# Patient Record
Sex: Female | Born: 1980 | Race: White | Hispanic: No | Marital: Single | Smoking: Current every day smoker
Health system: Southern US, Community
[De-identification: ages and names within clinical notes are randomized; demographics above are authoritative.]

## PROBLEM LIST (undated history)

## (undated) ENCOUNTER — Inpatient Hospital Stay (HOSPITAL_COMMUNITY): Payer: Self-pay

## (undated) DIAGNOSIS — N83209 Unspecified ovarian cyst, unspecified side: Secondary | ICD-10-CM

## (undated) DIAGNOSIS — R51 Headache: Secondary | ICD-10-CM

## (undated) DIAGNOSIS — O24419 Gestational diabetes mellitus in pregnancy, unspecified control: Secondary | ICD-10-CM

## (undated) DIAGNOSIS — F419 Anxiety disorder, unspecified: Secondary | ICD-10-CM

## (undated) DIAGNOSIS — B999 Unspecified infectious disease: Secondary | ICD-10-CM

## (undated) DIAGNOSIS — I10 Essential (primary) hypertension: Secondary | ICD-10-CM

## (undated) DIAGNOSIS — Z5189 Encounter for other specified aftercare: Secondary | ICD-10-CM

## (undated) HISTORY — DX: Gestational diabetes mellitus in pregnancy, unspecified control: O24.419

## (undated) HISTORY — PX: DILATION AND CURETTAGE OF UTERUS: SHX78

## (undated) HISTORY — PX: CHOLECYSTECTOMY: SHX55

---

## 2000-10-26 ENCOUNTER — Emergency Department (HOSPITAL_COMMUNITY): Admission: EM | Admit: 2000-10-26 | Discharge: 2000-10-26 | Payer: Self-pay | Admitting: Emergency Medicine

## 2001-01-24 ENCOUNTER — Emergency Department (HOSPITAL_COMMUNITY): Admission: EM | Admit: 2001-01-24 | Discharge: 2001-01-24 | Payer: Self-pay | Admitting: Emergency Medicine

## 2001-01-24 ENCOUNTER — Encounter: Payer: Self-pay | Admitting: Emergency Medicine

## 2003-11-30 ENCOUNTER — Emergency Department (HOSPITAL_COMMUNITY): Admission: EM | Admit: 2003-11-30 | Discharge: 2003-11-30 | Payer: Self-pay | Admitting: Emergency Medicine

## 2004-11-05 ENCOUNTER — Emergency Department (HOSPITAL_COMMUNITY): Admission: EM | Admit: 2004-11-05 | Discharge: 2004-11-05 | Payer: Self-pay | Admitting: Emergency Medicine

## 2006-05-26 DIAGNOSIS — Z5189 Encounter for other specified aftercare: Secondary | ICD-10-CM

## 2006-05-26 HISTORY — DX: Encounter for other specified aftercare: Z51.89

## 2007-02-12 ENCOUNTER — Ambulatory Visit: Payer: Self-pay | Admitting: Hematology & Oncology

## 2007-04-13 ENCOUNTER — Encounter: Payer: Self-pay | Admitting: Obstetrics and Gynecology

## 2007-04-13 ENCOUNTER — Inpatient Hospital Stay (HOSPITAL_COMMUNITY): Admission: AD | Admit: 2007-04-13 | Discharge: 2007-04-15 | Payer: Self-pay | Admitting: Obstetrics & Gynecology

## 2007-04-13 ENCOUNTER — Ambulatory Visit: Payer: Self-pay | Admitting: Obstetrics and Gynecology

## 2007-06-24 ENCOUNTER — Encounter: Admission: RE | Admit: 2007-06-24 | Discharge: 2007-08-10 | Payer: Self-pay | Admitting: Orthopedic Surgery

## 2008-03-07 ENCOUNTER — Encounter: Admission: RE | Admit: 2008-03-07 | Discharge: 2008-05-25 | Payer: Self-pay | Admitting: Orthopedic Surgery

## 2008-12-12 ENCOUNTER — Encounter: Admission: RE | Admit: 2008-12-12 | Discharge: 2008-12-12 | Payer: Self-pay | Admitting: Family Medicine

## 2009-01-08 ENCOUNTER — Emergency Department (HOSPITAL_COMMUNITY): Admission: EM | Admit: 2009-01-08 | Discharge: 2009-01-08 | Payer: Self-pay | Admitting: Emergency Medicine

## 2009-05-07 ENCOUNTER — Encounter: Admission: RE | Admit: 2009-05-07 | Discharge: 2009-05-24 | Payer: Self-pay | Admitting: Family Medicine

## 2009-05-24 ENCOUNTER — Encounter: Admission: RE | Admit: 2009-05-24 | Discharge: 2009-07-03 | Payer: Self-pay | Admitting: Family Medicine

## 2009-07-27 ENCOUNTER — Encounter: Admission: RE | Admit: 2009-07-27 | Discharge: 2009-07-27 | Payer: Self-pay | Admitting: Family Medicine

## 2010-01-30 ENCOUNTER — Emergency Department (HOSPITAL_COMMUNITY): Admission: EM | Admit: 2010-01-30 | Discharge: 2010-01-31 | Payer: Self-pay | Admitting: Emergency Medicine

## 2010-03-18 ENCOUNTER — Emergency Department (HOSPITAL_COMMUNITY): Admission: EM | Admit: 2010-03-18 | Discharge: 2010-03-18 | Payer: Self-pay | Admitting: Emergency Medicine

## 2010-06-16 ENCOUNTER — Encounter: Payer: Self-pay | Admitting: Family Medicine

## 2010-06-26 ENCOUNTER — Emergency Department (HOSPITAL_COMMUNITY)
Admission: EM | Admit: 2010-06-26 | Discharge: 2010-06-26 | Disposition: A | Discharge status: Home / Self Care | Payer: Self-pay | Attending: Emergency Medicine | Admitting: Emergency Medicine

## 2010-06-26 DIAGNOSIS — Y929 Unspecified place or not applicable: Secondary | ICD-10-CM | POA: Insufficient documentation

## 2010-06-26 DIAGNOSIS — S025XXA Fracture of tooth (traumatic), initial encounter for closed fracture: Secondary | ICD-10-CM | POA: Insufficient documentation

## 2010-06-26 DIAGNOSIS — Z79899 Other long term (current) drug therapy: Secondary | ICD-10-CM | POA: Insufficient documentation

## 2010-06-26 DIAGNOSIS — F411 Generalized anxiety disorder: Secondary | ICD-10-CM | POA: Insufficient documentation

## 2010-06-26 DIAGNOSIS — X58XXXA Exposure to other specified factors, initial encounter: Secondary | ICD-10-CM | POA: Insufficient documentation

## 2010-06-26 DIAGNOSIS — K089 Disorder of teeth and supporting structures, unspecified: Secondary | ICD-10-CM | POA: Insufficient documentation

## 2010-08-08 LAB — BASIC METABOLIC PANEL
BUN: 10 mg/dL (ref 6–23)
CO2: 25 mEq/L (ref 19–32)
Chloride: 105 mEq/L (ref 96–112)
GFR calc non Af Amer: >60 mL/min (ref >60)
Glucose, Bld: 122 mg/dL — ABNORMAL HIGH (ref 70–99)
Potassium: 3.4 mEq/L — ABNORMAL LOW (ref 3.5–5.1)

## 2010-08-08 LAB — CBC
HCT: 46.9 % — ABNORMAL HIGH (ref 36.0–46.0)
MCH: 32.1 pg (ref 26.0–34.0)
MCHC: 34.2 g/dL (ref 30.0–36.0)
MCV: 94 fL (ref 78.0–100.0)
RDW: 13.9 % (ref 11.5–15.5)

## 2010-08-08 LAB — DIFFERENTIAL
Basophils Absolute: 0.1 10*3/uL (ref 0.0–0.1)
Basophils Relative: 0 % (ref 0–1)
Eosinophils Absolute: 0.3 10*3/uL (ref 0.0–0.7)
Eosinophils Relative: 2 % (ref 0–5)
Monocytes Absolute: 1.1 10*3/uL — ABNORMAL HIGH (ref 0.1–1.0)
Monocytes Relative: 8 % (ref 3–12)

## 2010-08-08 LAB — URINE CULTURE: Culture: NO GROWTH

## 2010-08-08 LAB — URINE MICROSCOPIC-ADD ON

## 2010-08-08 LAB — URINALYSIS, ROUTINE W REFLEX MICROSCOPIC
Hgb urine dipstick: NEGATIVE
Specific Gravity, Urine: 1.031 — ABNORMAL HIGH (ref 1.005–1.030)
Urobilinogen, UA: >8 mg/dL — ABNORMAL HIGH (ref 0.0–1.0)

## 2010-10-08 NOTE — Op Note (Signed)
Tammy, Robinson               ACCOUNT NO.:  [id number]   MEDICAL RECORD NO.:  [id number]          PATIENT TYPE:  INP   LOCATION:  9131                          FACILITY:  [facility]   PHYSICIAN:  Tammy Robinson, M.D.     DATE OF BIRTH:  03/16/1981   DATE OF PROCEDURE:  04/13/2007  DATE OF DISCHARGE:                               OPERATIVE REPORT   PROCEDURE:  Pelvic examination under anesthesia, dilatation and  curettage.   PREOPERATIVE DIAGNOSIS:  Postpartum hemorrhage.   POSTOPERATIVE DIAGNOSIS:  Retained placental cotyledon.   SURGEON:  Javier Glazier. Okey Robinson, M.D.   ANESTHESIA:  General.   ESTIMATED BLOOD LOSS:  100 mL.   POSTOPERATIVE CONDITION:  Satisfactory.   REASON FOR PROCEDURE:  The patient, a 30 year old multiparous white  female, who delivered approximately 6:00 a.m. in the morning, was  transferred to the floor. I got call at at 12:10 p.m. The patient was  hemorrhaging.  I went to examine her, found it very difficult to examine  her in bed, took her to the operating room under general anesthesia to  look for any lacerations as the uterus was very firm.  No lacerations  were seen within the vagina or the cervix.  There was one small bleeding  area at the left angle of the cervix for which a figure-of-eight suture  was used to control.  The uterus was explored with a ring forceps  followed by curettage with a large banjo __________  curette and a large  cotyledon was obtained from the endometrial cavity.  The uterus  immediately became firm.  Bleeding reduced to just a trickle and the  patient was transferred to recovery room in satisfactory condition  having tolerated the procedure well.      Tammy Robinson, M.D.  Electronically Signed     PDR/MEDQ  D:  04/13/2007  T:  04/14/2007  Job:  [id number]

## 2010-10-08 NOTE — Discharge Summary (Signed)
NAMELAYLAH, Tammy Robinson               ACCOUNT NO.:  [id number]   MEDICAL RECORD NO.:  [id number]          PATIENT TYPE:  INP   LOCATION:  9131                          FACILITY:  [facility]   PHYSICIAN:  Phil D. Okey Dupre, M.D.     DATE OF BIRTH:  03/09/1981   DATE OF ADMISSION:  04/13/2007  DATE OF DISCHARGE:  04/15/2007                               DISCHARGE SUMMARY   DIAGNOSES ON DISCHARGE:  1. Spontaneous vaginal delivery of a viable female.  2. Postpartum hemorrhage with retained products.  3. Dilation and curettage April 13, 2007.  4. Anemia secondary to hemorrhage.  5. Transfusion two units of packed red blood cells.   BRIEF HOSPITAL COURSE:  [name] is a 30 year old G4, now P3 0-1-3,  who was admitted to the hospital in [facility] November 18, where she  had spontaneous vaginal delivery of a viable female with Apgars of 9 and 9  at 0630 on November 18.  Placenta was delivered spontaneously with three  vessel cord and appeared intact upon inspection with estimated blood  loss of 350 ml.  Following delivery, the patient continued to have  bleeding and was given IM Methergine in the labor suite with good  resolution of bleeding. However, upon being moved to the postpartum  floor, the patient ambulated and had a large amount of vaginal bleeding  with large clots and continuous bleeding. The patient was evaluated and  taken to the operating room where dilation and curettage was performed,  which was significant for some retained products of conception.  Following D and C, the patient's bleeding normalized, however, serial  hemoglobin showed that the patient's hemoglobin had gone from 13.5 to  5.8 on the morning of November 20. The patient was largely asymptomatic,  although she was pale and mildly tachycardic. She was transfused two  units of packed red blood cells on November 20, without complication,  following which the patient felt much more energetic. She ambulated well  without any  dizziness, lightheadedness, headache. Her lochia was scant  and she was felt to be in good condition to be discharged home. She will  be discharged with a prescription for ferrous sulfate to be taken twice  a day as well as Colace, multi-vitamin and ibuprofen. The patient was  bottle feeding and was given an intramuscular  injection of Depo Provera  on discharge. She will follow up at the [facility]  Department when she desires an intrauterine device for contraception.  The patient is RH positive rubella immune and GBS negative.     ______________________________  Raymon Mutton, MD      Phil D. Okey Dupre, M.D.  Electronically Signed    JP/MEDQ  D:  04/15/2007  T:  04/16/2007  Job:  [id number]

## 2010-10-29 ENCOUNTER — Emergency Department (HOSPITAL_COMMUNITY)
Admission: EM | Admit: 2010-10-29 | Discharge: 2010-10-29 | Disposition: A | Discharge status: Home / Self Care | Payer: Self-pay | Attending: Emergency Medicine | Admitting: Emergency Medicine

## 2010-10-29 DIAGNOSIS — R300 Dysuria: Secondary | ICD-10-CM | POA: Insufficient documentation

## 2010-10-29 DIAGNOSIS — R10819 Abdominal tenderness, unspecified site: Secondary | ICD-10-CM | POA: Insufficient documentation

## 2010-10-29 DIAGNOSIS — N390 Urinary tract infection, site not specified: Secondary | ICD-10-CM | POA: Insufficient documentation

## 2010-10-29 LAB — URINALYSIS, ROUTINE W REFLEX MICROSCOPIC
Bilirubin Urine: NEGATIVE
Glucose, UA: NEGATIVE mg/dL
Protein, ur: 100 mg/dL — AB
Urobilinogen, UA: 1 mg/dL (ref 0.0–1.0)

## 2010-10-29 LAB — URINE MICROSCOPIC-ADD ON

## 2010-12-12 ENCOUNTER — Emergency Department (HOSPITAL_COMMUNITY)
Admission: EM | Admit: 2010-12-12 | Discharge: 2010-12-12 | Disposition: A | Discharge status: Home / Self Care | Payer: Self-pay | Attending: Emergency Medicine | Admitting: Emergency Medicine

## 2010-12-12 DIAGNOSIS — K0381 Cracked tooth: Secondary | ICD-10-CM | POA: Insufficient documentation

## 2010-12-12 DIAGNOSIS — K029 Dental caries, unspecified: Secondary | ICD-10-CM | POA: Insufficient documentation

## 2010-12-12 DIAGNOSIS — K089 Disorder of teeth and supporting structures, unspecified: Secondary | ICD-10-CM | POA: Insufficient documentation

## 2011-02-06 ENCOUNTER — Emergency Department (HOSPITAL_COMMUNITY)
Admission: EM | Admit: 2011-02-06 | Discharge: 2011-02-06 | Disposition: A | Discharge status: Home / Self Care | Payer: Self-pay | Attending: Emergency Medicine | Admitting: Emergency Medicine

## 2011-02-06 DIAGNOSIS — G4700 Insomnia, unspecified: Secondary | ICD-10-CM | POA: Insufficient documentation

## 2011-02-06 DIAGNOSIS — S060X0A Concussion without loss of consciousness, initial encounter: Secondary | ICD-10-CM | POA: Insufficient documentation

## 2011-02-06 DIAGNOSIS — W1809XA Striking against other object with subsequent fall, initial encounter: Secondary | ICD-10-CM | POA: Insufficient documentation

## 2011-03-04 LAB — CBC
HCT: 16.6 — ABNORMAL LOW
HCT: 20.1 — ABNORMAL LOW
HCT: 22.5 — ABNORMAL LOW
HCT: 38.8
Hemoglobin: 7.7 — CL
MCHC: 34.3
MCHC: 34.7
MCV: 91.9
MCV: 92.8
MCV: 93.4
Platelets: 279
Platelets: 284
Platelets: 318
Platelets: 405 — ABNORMAL HIGH
RBC: 2.41 — ABNORMAL LOW
RBC: 2.49 — ABNORMAL LOW
RBC: 4.22
RDW: 14.8
WBC: 11.1 — ABNORMAL HIGH
WBC: 13.8 — ABNORMAL HIGH
WBC: 14.6 — ABNORMAL HIGH
WBC: 19.3 — ABNORMAL HIGH
WBC: 20.2 — ABNORMAL HIGH

## 2011-03-04 LAB — TYPE AND SCREEN: Antibody Screen: NEGATIVE

## 2011-03-04 LAB — RPR: RPR Ser Ql: NONREACTIVE

## 2011-03-04 LAB — DIFFERENTIAL
Eosinophils Absolute: 0.1 — ABNORMAL LOW
Lymphocytes Relative: 14
Lymphs Abs: 2.8
Monocytes Relative: 6
Neutro Abs: 16.1 — ABNORMAL HIGH
Neutrophils Relative %: 80 — ABNORMAL HIGH

## 2011-03-04 LAB — DIC (DISSEMINATED INTRAVASCULAR COAGULATION)PANEL
D-Dimer, Quant: 1.15 — ABNORMAL HIGH
Fibrinogen: 367
Platelets: 378
Smear Review: NONE SEEN
aPTT: 25

## 2011-03-04 LAB — ABO/RH: ABO/RH(D): O POS

## 2011-03-04 LAB — HEPATITIS B SURFACE ANTIGEN: Hepatitis B Surface Ag: NEGATIVE

## 2012-02-04 ENCOUNTER — Emergency Department (HOSPITAL_COMMUNITY): Payer: No Typology Code available for payment source

## 2012-02-04 ENCOUNTER — Emergency Department (HOSPITAL_COMMUNITY)
Admission: EM | Admit: 2012-02-04 | Discharge: 2012-02-04 | Disposition: A | Discharge status: Home / Self Care | Payer: No Typology Code available for payment source | Attending: Emergency Medicine | Admitting: Emergency Medicine

## 2012-02-04 ENCOUNTER — Encounter (HOSPITAL_COMMUNITY): Payer: Self-pay | Admitting: Emergency Medicine

## 2012-02-04 DIAGNOSIS — S0003XA Contusion of scalp, initial encounter: Secondary | ICD-10-CM | POA: Insufficient documentation

## 2012-02-04 DIAGNOSIS — S0093XA Contusion of unspecified part of head, initial encounter: Secondary | ICD-10-CM

## 2012-02-04 MED ORDER — ACETAMINOPHEN 325 MG PO TABS
650.0000 mg | ORAL_TABLET | Freq: Once | ORAL | Status: AC
  Administered 2012-02-04: 650 mg via ORAL
  Filled 2012-02-04: qty 2

## 2012-02-04 MED ORDER — IBUPROFEN 800 MG PO TABS
800.0000 mg | ORAL_TABLET | Freq: Once | ORAL | Status: AC
  Administered 2012-02-04: 800 mg via ORAL
  Filled 2012-02-04: qty 1
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
## 2012-10-28 ENCOUNTER — Encounter (HOSPITAL_COMMUNITY): Payer: Self-pay | Admitting: Emergency Medicine

## 2012-10-28 ENCOUNTER — Emergency Department (HOSPITAL_COMMUNITY)
Admission: EM | Admit: 2012-10-28 | Discharge: 2012-10-28 | Discharge status: Against Medical Advice | Payer: Medicaid Other | Attending: Emergency Medicine | Admitting: Emergency Medicine

## 2012-10-28 ENCOUNTER — Emergency Department (HOSPITAL_COMMUNITY): Payer: Medicaid Other

## 2012-10-28 DIAGNOSIS — R51 Headache: Secondary | ICD-10-CM | POA: Insufficient documentation

## 2012-10-28 DIAGNOSIS — R1031 Right lower quadrant pain: Secondary | ICD-10-CM | POA: Insufficient documentation

## 2012-10-28 DIAGNOSIS — O99330 Smoking (tobacco) complicating pregnancy, unspecified trimester: Secondary | ICD-10-CM | POA: Insufficient documentation

## 2012-10-28 DIAGNOSIS — O210 Mild hyperemesis gravidarum: Secondary | ICD-10-CM | POA: Insufficient documentation

## 2012-10-28 DIAGNOSIS — Z9089 Acquired absence of other organs: Secondary | ICD-10-CM | POA: Insufficient documentation

## 2012-10-28 DIAGNOSIS — Z3490 Encounter for supervision of normal pregnancy, unspecified, unspecified trimester: Secondary | ICD-10-CM

## 2012-10-28 DIAGNOSIS — Z880 Allergy status to penicillin: Secondary | ICD-10-CM | POA: Insufficient documentation

## 2012-10-28 DIAGNOSIS — Z79899 Other long term (current) drug therapy: Secondary | ICD-10-CM | POA: Insufficient documentation

## 2012-10-28 DIAGNOSIS — O9989 Other specified diseases and conditions complicating pregnancy, childbirth and the puerperium: Secondary | ICD-10-CM | POA: Insufficient documentation

## 2012-10-28 LAB — URINALYSIS, ROUTINE W REFLEX MICROSCOPIC
Glucose, UA: NEGATIVE mg/dL
Ketones, ur: NEGATIVE mg/dL
Nitrite: NEGATIVE
Specific Gravity, Urine: 1.023 (ref 1.005–1.030)
pH: 6 (ref 5.0–8.0)

## 2012-10-28 LAB — CBC WITH DIFFERENTIAL/PLATELET
Basophils Absolute: 0 10*3/uL (ref 0.0–0.1)
Eosinophils Absolute: 0.2 10*3/uL (ref 0.0–0.7)
Eosinophils Relative: 1 % (ref 0–5)
HCT: 41.6 % (ref 36.0–46.0)
Lymphocytes Relative: 21 % (ref 12–46)
Lymphs Abs: 2.8 10*3/uL (ref 0.7–4.0)
MCH: 31.6 pg (ref 26.0–34.0)
MCV: 92.7 fL (ref 78.0–100.0)
Monocytes Absolute: 0.9 10*3/uL (ref 0.1–1.0)
Platelets: 365 10*3/uL (ref 150–400)
RDW: 14 % (ref 11.5–15.5)
WBC: 13.3 10*3/uL — ABNORMAL HIGH (ref 4.0–10.5)

## 2012-10-28 LAB — URINE MICROSCOPIC-ADD ON

## 2012-10-28 LAB — WET PREP, GENITAL: Yeast Wet Prep HPF POC: NONE SEEN

## 2012-10-28 LAB — COMPREHENSIVE METABOLIC PANEL
CO2: 26 mEq/L (ref 19–32)
Calcium: 9.6 mg/dL (ref 8.4–10.5)
Creatinine, Ser: 0.65 mg/dL (ref 0.50–1.10)
GFR calc Af Amer: >90 mL/min (ref >90)
GFR calc non Af Amer: >90 mL/min (ref >90)
Glucose, Bld: 87 mg/dL (ref 70–99)
Total Protein: 7 g/dL (ref 6.0–8.3)

## 2012-10-28 LAB — HCG, QUANTITATIVE, PREGNANCY: hCG, Beta Chain, Quant, S: 14056 m[IU]/mL — ABNORMAL HIGH (ref <5)

## 2012-10-28 LAB — ABO/RH: ABO/RH(D): O POS

## 2012-10-28 LAB — POCT PREGNANCY, URINE: Preg Test, Ur: POSITIVE — AB

## 2012-10-28 MED ORDER — HYDROMORPHONE HCL PF 1 MG/ML IJ SOLN
1.0000 mg | Freq: Once | INTRAMUSCULAR | Status: AC
  Administered 2012-10-28: 1 mg via INTRAVENOUS
  Filled 2012-10-28: qty 1

## 2012-10-28 MED ORDER — SODIUM CHLORIDE 0.9 % IV SOLN
1000.0000 mL | Freq: Once | INTRAVENOUS | Status: AC
  Administered 2012-10-28: 1000 mL via INTRAVENOUS

## 2012-10-28 MED ORDER — ONDANSETRON HCL 4 MG/2ML IJ SOLN
4.0000 mg | Freq: Once | INTRAMUSCULAR | Status: AC
  Administered 2012-10-28: 4 mg via INTRAVENOUS
  Filled 2012-10-28: qty 2

## 2012-10-28 MED ORDER — SODIUM CHLORIDE 0.9 % IV SOLN
1000.0000 mL | INTRAVENOUS | Status: DC
  Administered 2012-10-28: 1000 mL via INTRAVENOUS

## 2012-10-28 MED ORDER — ONDANSETRON HCL 4 MG PO TABS: 4.0000 mg | ORAL_TABLET | Freq: Four times a day (QID) | ORAL | Status: DC

## 2012-10-28 NOTE — ED Provider Notes (Signed)
History     CSN: [id number]  Arrival date & time 10/28/12  1556   First MD Initiated Contact with Patient 10/28/12 1626      Chief Complaint  Patient presents with  . Abdominal Pain    (Consider location/radiation/quality/duration/timing/severity/associated sxs/prior treatment) Patient is a 32 y.o. female presenting with abdominal pain. The history is provided by the patient and medical records. No language interpreter was used.  Abdominal Pain This is a new problem. The current episode started today. The problem occurs constantly. The problem has been gradually worsening. Associated symptoms include abdominal pain, headaches, nausea and vomiting. Pertinent negatives include no anorexia, arthralgias, change in bowel habit, chest pain, chills, congestion, coughing, diaphoresis, fatigue, fever, joint swelling, myalgias, neck pain, numbness, rash, sore throat, swollen glands, urinary symptoms, vertigo, visual change or weakness. Exacerbated by: palpation. She has tried nothing for the symptoms.   Tammy Robinson is a 32 y.o. female  With a medical history of anxiety and depression as well as migraine headache, [id number] presents to the Emergency Department complaining of gradual, persistent, progressively worsening right lower quadrant abdominal pain with associated nausea and vomiting beginning this morning. Patient's last menstrual cycle 09/17/2012. She states she is not on any birth control as they were attempting to get pregnant.  Nothing makes the pain better and movement and palpation makes pain worse. Patient denies fever, chills, headache, neck pain chest pain, shortness of breath, dysuria, hematuria melena, dizziness, syncope.   History reviewed. No pertinent past medical history.  Past Surgical History  Procedure Laterality Date  . Cholecystectomy      No family history on file.  History  Substance Use Topics  . Smoking status: Current Some Day Smoker  . Smokeless tobacco: Not  on file  . Alcohol Use: No    OB History   Grav Para Term Preterm Abortions TAB SAB Ect Mult Living                  Review of Systems  Constitutional: Negative for fever, chills, diaphoresis, appetite change, fatigue and unexpected weight change.  HENT: Negative for congestion, sore throat, mouth sores, trouble swallowing, neck pain and neck stiffness.   Respiratory: Negative for cough, chest tightness, shortness of breath, wheezing and stridor.   Cardiovascular: Negative for chest pain and palpitations.  Gastrointestinal: Positive for nausea, vomiting and abdominal pain. Negative for diarrhea, constipation, blood in stool, abdominal distention, rectal pain, anorexia and change in bowel habit.  Genitourinary: Negative for dysuria, urgency, frequency, hematuria, flank pain and difficulty urinating.  Musculoskeletal: Negative for myalgias, back pain, joint swelling and arthralgias.  Skin: Negative for rash.  Neurological: Positive for headaches. Negative for vertigo, weakness and numbness.  Hematological: Negative for adenopathy.  Psychiatric/Behavioral: Negative for confusion.  All other systems reviewed and are negative.    Allergies  Penicillins  Home Medications   Current Outpatient Rx  Name  Route  Sig  Dispense  Refill  . albuterol (PROVENTIL) (2.5 MG/3ML) 0.083% nebulizer solution   Nebulization   Take 2.5 mg by nebulization every 6 (six) hours as needed for wheezing.         Marland Kitchen ALPRAZolam (XANAX) 1 MG tablet   Oral   Take 1 mg by mouth 2 (two) times daily as needed.         . butalbital-acetaminophen-caffeine (FIORICET, ESGIC) 50-325-40 MG per tablet   Oral   Take 1 tablet by mouth 2 (two) times daily as needed for headache.         Marland Kitchen  citalopram (CELEXA) 20 MG tablet   Oral   Take 20 mg by mouth daily.         . cyclobenzaprine (FLEXERIL) 10 MG tablet   Oral   Take 10 mg by mouth 4 (four) times daily.         Marland Kitchen omeprazole (PRILOSEC) 20 MG capsule    Oral   Take 20 mg by mouth daily.         . traMADol (ULTRAM) 50 MG tablet   Oral   Take 50 mg by mouth every 6 (six) hours as needed for pain.         Marland Kitchen ondansetron (ZOFRAN) 4 MG tablet   Oral   Take 1 tablet (4 mg total) by mouth every 6 (six) hours.   12 tablet   0     BP 123/73  Pulse 94  Temp(Src) 98.8 F (37.1 C) (Oral)  Resp 18  SpO2 97%  LMP 09/17/2012  Physical Exam  Nursing note and vitals reviewed. Constitutional: She is oriented to person, place, and time. She appears well-developed and well-nourished. No distress.  HENT:  Head: Normocephalic and atraumatic.  Mouth/Throat: Oropharynx is clear and moist.  Eyes: Conjunctivae and EOM are normal. Pupils are equal, round, and reactive to light. No scleral icterus.  Neck: Normal range of motion. Neck supple.  Cardiovascular: Normal rate, regular rhythm, normal heart sounds and intact distal pulses.   No murmur heard. Pulmonary/Chest: Effort normal and breath sounds normal. No respiratory distress. She has no wheezes. She has no rales. She exhibits no tenderness.  Abdominal: Soft. Normal appearance and bowel sounds are normal. She exhibits no distension and no mass. There is no hepatosplenomegaly. There is tenderness in the right lower quadrant. There is guarding and tenderness at McBurney's point. There is no rigidity, no rebound, no CVA tenderness and negative Murphy's sign. Hernia confirmed negative in the right inguinal area and confirmed negative in the left inguinal area.    Genitourinary: Uterus normal. Pelvic exam was performed with patient prone. There is no rash, tenderness or lesion on the right labia. There is no rash, tenderness or lesion on the left labia. Uterus is not deviated, not enlarged, not fixed and not tender. Cervix exhibits no motion tenderness, no discharge and no friability. Right adnexum displays tenderness. Right adnexum displays no mass and no fullness. Left adnexum displays no mass, no  tenderness and no fullness. No erythema, tenderness or bleeding around the vagina. No foreign body around the vagina. No signs of injury around the vagina. No vaginal discharge found.  Cervical os closed  Musculoskeletal: Normal range of motion. She exhibits no edema and no tenderness.  Lymphadenopathy:    She has no cervical adenopathy.       Right: No inguinal adenopathy present.       Left: No inguinal adenopathy present.  Neurological: She is alert and oriented to person, place, and time. She exhibits normal muscle tone. Coordination normal.  Speech is clear and goal oriented Moves extremities without ataxia  Skin: Skin is warm and dry. No rash noted. She is not diaphoretic. No erythema.  Psychiatric: She has a normal mood and affect. Her behavior is normal.    ED Course  Procedures (including critical care time)  Labs Reviewed  WET PREP, GENITAL - Abnormal; Notable for the following:    Clue Cells Wet Prep HPF POC FEW (*)    WBC, Wet Prep HPF POC FEW (*)    All other components within  normal limits  URINALYSIS, ROUTINE W REFLEX MICROSCOPIC - Abnormal; Notable for the following:    APPearance CLOUDY (*)    Leukocytes, UA TRACE (*)    All other components within normal limits  CBC WITH DIFFERENTIAL - Abnormal; Notable for the following:    WBC 13.3 (*)    Neutro Abs 9.4 (*)    All other components within normal limits  COMPREHENSIVE METABOLIC PANEL - Abnormal; Notable for the following:    Potassium 3.4 (*)    Total Bilirubin 0.2 (*)    All other components within normal limits  HCG, QUANTITATIVE, PREGNANCY - Abnormal; Notable for the following:    hCG, Beta Chain, Quant, S 14056 (*)    All other components within normal limits  POCT PREGNANCY, URINE - Abnormal; Notable for the following:    Preg Test, Ur POSITIVE (*)    All other components within normal limits  GC/CHLAMYDIA PROBE AMP  URINE MICROSCOPIC-ADD ON  ABO/RH   US Ob Comp Less 14 Wks  10/28/2012   *RADIOLOGY  REPORT*  Clinical Data: Abdominal pain, positive pregnancy test  OBSTETRIC <14 WK [place] AND TRANSVAGINAL OB US  Technique:  Both transabdominal and transvaginal ultrasound examinations were performed for complete evaluation of the gestation as well as the maternal uterus, adnexal regions, and pelvic cul-de-sac.  Transvaginal technique was performed to assess early pregnancy.  Comparison:  None.  Intrauterine gestational sac:  Visualized/normal in shape. Yolk sac: Visualized Embryo: Visualized Cardiac Activity: Visualized but crown-rump length too small for accurate M-mode measurement of fetal heart rate  CRL: 3  mm  5 w  6 d          [place] EDC: 06/24/13  Maternal uterus/adnexae: The ovaries are normal.  IMPRESSION: Intrauterine gestational sac, yolk sac, and fetal pole with visible cardiac activity, concordant with dates by crown-rump length compared to assigned gestational age by LMP.  No acute abnormality.   Original Report Authenticated By: Christiana Pellant, M.D.   US Ob Transvaginal  10/28/2012   *RADIOLOGY REPORT*  Clinical Data: Abdominal pain, positive pregnancy test  OBSTETRIC <14 WK [place] AND TRANSVAGINAL OB US  Technique:  Both transabdominal and transvaginal ultrasound examinations were performed for complete evaluation of the gestation as well as the maternal uterus, adnexal regions, and pelvic cul-de-sac.  Transvaginal technique was performed to assess early pregnancy.  Comparison:  None.  Intrauterine gestational sac:  Visualized/normal in shape. Yolk sac: Visualized Embryo: Visualized Cardiac Activity: Visualized but crown-rump length too small for accurate M-mode measurement of fetal heart rate  CRL: 3  mm  5 w  6 d          [place] EDC: 06/24/13  Maternal uterus/adnexae: The ovaries are normal.  IMPRESSION: Intrauterine gestational sac, yolk sac, and fetal pole with visible cardiac activity, concordant with dates by crown-rump length compared to assigned gestational age by LMP.  No acute abnormality.   Original  Report Authenticated By: Christiana Pellant, M.D.     1. Abdominal pain, RLQ   2. Pregnancy at early stage       MDM  Jarelis L Arseneau presents with 1 day of RLQ abd pain and concern about pregnancy since pt missed last period.  Patient with positive pregnancy test, wet prep without evidence of vaginal infection or BP, Rh+ knee without evidence of urinary tract infection, CBC with mild leukocytosis of 13.3.  Patient afebrile, nontoxic, nonseptic appearing.  Ultrasound with intrauterine sac, yolk sac and fetal pole with visible cardiac activity estimated  at 5 weeks and 6 days which is consistent with patient's last menstrual period.  Patient given pain control with persistent right lower quadrant pain and tenderness to palpation. Concern for appendicitis. Recommended the patient have abdominal MRI to rule out appendicitis, patient states she must leave to go home to her children and her husband up from work.  I discussed at length the possibility of a missed appendicitis and the consequences of appendicitis rupture including sepsis and death. She states understanding and wishes to be discharged AMA.  I gave strict return precautions either to [facility] emergency department or the MAU at [facility] hospital. Patient states understanding and is amenable to the plan.  Also recommended followup with her OB/GYN for further prenatal care.  Patient VSS at this time.    Dr. Donette Larry was consulted, evaluated this patient with me and agrees with the plan.          Dahlia Client Kisean Rollo, PA-C 10/28/12 2012  Elzabeth Mcquerry, PA-C 10/28/12 2245

## 2012-10-28 NOTE — ED Notes (Signed)
Per patient, has not had period since April 25th, doesn't know if she is pregnant-increased abdominal cramping this am, no spotting-N/V

## 2012-10-28 NOTE — Progress Notes (Signed)
   CARE MANAGEMENT ED NOTE 10/28/2012  Patient:  VALORI, HOLLENKAMP   Account Number:  [id number]  Date Initiated:  10/28/2012  Documentation initiated by:  Radford Pax  Subjective/Objective Assessment:     Subjective/Objective Assessment Detail:     Action/Plan:   Action/Plan Detail:   Anticipated DC Date:       Status Recommendation to Physician:   Result of Recommendation:    Other ED Services  Consult Working Plan    DC Planning Services  Other  PCP issues    Choice offered to / List presented to:            Status of service:  Completed, signed off  ED Comments:   ED Comments Detail:  Patient listed as not having a PCP.  EDCM spoke to patient who stated [facility] [place] [facility].  Patient also has Medicaid insurance per patient.

## 2012-10-28 NOTE — ED Notes (Signed)
Bed:WA15<BR> Expected date:<BR> Expected time:<BR> Means of arrival:<BR> Comments:<BR> Hold for triage 

## 2012-10-29 ENCOUNTER — Inpatient Hospital Stay (HOSPITAL_COMMUNITY)
Admission: AD | Admit: 2012-10-29 | Discharge: 2012-10-29 | Disposition: A | Discharge status: Home / Self Care | Payer: Medicaid Other | Source: Ambulatory Visit | Attending: Family Medicine | Admitting: Family Medicine

## 2012-10-29 ENCOUNTER — Encounter (HOSPITAL_COMMUNITY): Payer: Self-pay

## 2012-10-29 ENCOUNTER — Inpatient Hospital Stay (HOSPITAL_COMMUNITY): Payer: Medicaid Other

## 2012-10-29 DIAGNOSIS — O219 Vomiting of pregnancy, unspecified: Secondary | ICD-10-CM

## 2012-10-29 DIAGNOSIS — O9989 Other specified diseases and conditions complicating pregnancy, childbirth and the puerperium: Secondary | ICD-10-CM

## 2012-10-29 DIAGNOSIS — R109 Unspecified abdominal pain: Secondary | ICD-10-CM | POA: Insufficient documentation

## 2012-10-29 DIAGNOSIS — O210 Mild hyperemesis gravidarum: Secondary | ICD-10-CM | POA: Insufficient documentation

## 2012-10-29 LAB — CBC
HCT: 39.1 % (ref 36.0–46.0)
Hemoglobin: 13.3 g/dL (ref 12.0–15.0)
MCH: 32 pg (ref 26.0–34.0)
MCHC: 34 g/dL (ref 30.0–36.0)

## 2012-10-29 LAB — URINALYSIS, ROUTINE W REFLEX MICROSCOPIC
Glucose, UA: NEGATIVE mg/dL
Hgb urine dipstick: NEGATIVE
Ketones, ur: NEGATIVE mg/dL
Protein, ur: NEGATIVE mg/dL

## 2012-10-29 MED ORDER — ONDANSETRON HCL 4 MG PO TABS: 4.0000 mg | ORAL_TABLET | Freq: Four times a day (QID) | ORAL | Status: DC

## 2012-10-29 MED ORDER — PROMETHAZINE HCL 25 MG PO TABS: 25.0000 mg | ORAL_TABLET | Freq: Four times a day (QID) | ORAL | Status: DC | PRN

## 2012-10-29 MED ORDER — HYDROMORPHONE HCL PF 1 MG/ML IJ SOLN
1.0000 mg | Freq: Once | INTRAMUSCULAR | Status: AC
  Administered 2012-10-29: 1 mg via INTRAMUSCULAR
  Filled 2012-10-29: qty 1

## 2012-10-29 MED ORDER — OXYCODONE-ACETAMINOPHEN 5-325 MG PO TABS: 1.0000 | ORAL_TABLET | Freq: Four times a day (QID) | ORAL | Status: DC | PRN

## 2012-10-29 NOTE — ED Provider Notes (Signed)
Medical screening examination/treatment/procedure(s) were conducted as a shared visit with non-physician practitioner(s) and myself.  I personally evaluated the patient during the encounter  1 day of RLQ pain with vomiting. Found to be pregnant. TTP RLQ without peritoneal signs.  IUP confirmed.  Planned MRI to evaluate for appendicitis.  Patient unwilling to stay and left AMA understanding risks including worsening condition and death.  Glynn Octave, MD 11/10/2012 [phone]

## 2012-10-29 NOTE — MAU Provider Note (Signed)
Chart reviewed and agree with management and plan.  

## 2012-10-29 NOTE — MAU Provider Note (Signed)
History     CSN: [id number]  Arrival date and time: 10/29/12 1453   None     Chief Complaint  Patient presents with  . Abdominal Pain   Abdominal Pain This is a new problem. The current episode started yesterday. The problem occurs constantly. The most recent episode lasted 12 hours. The problem has been gradually worsening. The pain is located in the RLQ. The pain is at a severity of 8/10. The quality of the pain is sharp. Pain radiation: R leg. Associated symptoms include nausea and vomiting. Pertinent negatives include no constipation, diarrhea or fever. She has tried nothing for the symptoms.  Pt denies vomiting today. Reports vomiting 5x yesterday around the time of her ED visit. Endorses morning emesis x1 and nausea throughout early pregnancy.  Tammy Robinson is a [id number], [redacted]w[redacted]d 32 y/o female presenting to the MAU with a CC of abdominal pain. Pt found out she was pregnant yesterday when she was seen in [place] ED for increased vomiting.   No past medical history on file.  Past Surgical History  Procedure Laterality Date  . Cholecystectomy    . Dilation and curettage of uterus      No family history on file.  History  Substance Use Topics  . Smoking status: Current Every Day Smoker -- 0.50 packs/day    Types: Cigarettes  . Smokeless tobacco: Not on file  . Alcohol Use: No    Allergies:  Allergies  Allergen Reactions  . Penicillins Hives    Prescriptions prior to admission  Medication Sig Dispense Refill  . albuterol (PROVENTIL) (2.5 MG/3ML) 0.083% nebulizer solution Take 2.5 mg by nebulization every 6 (six) hours as needed for wheezing.      Marland Kitchen ALPRAZolam (XANAX) 1 MG tablet Take 1 mg by mouth 2 (two) times daily as needed.      . butalbital-acetaminophen-caffeine (FIORICET, ESGIC) 50-325-40 MG per tablet Take 1 tablet by mouth 2 (two) times daily as needed for headache.      . citalopram (CELEXA) 20 MG tablet Take 20 mg by mouth daily.      . cyclobenzaprine (FLEXERIL)  10 MG tablet Take 10 mg by mouth 4 (four) times daily.      Marland Kitchen omeprazole (PRILOSEC) 20 MG capsule Take 20 mg by mouth daily.      . ondansetron (ZOFRAN) 4 MG tablet Take 1 tablet (4 mg total) by mouth every 6 (six) hours.  12 tablet  0  . traMADol (ULTRAM) 50 MG tablet Take 50 mg by mouth every 6 (six) hours as needed for pain.        Review of Systems  Constitutional: Negative for fever.  Respiratory: Negative for shortness of breath.   Gastrointestinal: Positive for nausea, vomiting and abdominal pain. Negative for diarrhea and constipation.       Last bowel movement yesterday morning and normal for her.  Genitourinary:       Denies vaginal bleeding, vaginal discharge.   Physical Exam   Blood pressure 124/78, pulse 97, temperature 97.7 F (36.5 C), temperature source Oral, resp. rate 18, last menstrual period 09/17/2012.  Physical Exam  Constitutional: She appears well-developed and well-nourished.  Respiratory: Effort normal. No respiratory distress.  GI: She exhibits no distension. There is tenderness in the right lower quadrant. There is no rigidity, no rebound and no guarding.  Negative Psoas sign. Negative Obturator sign. Negative Rosvings sign.   Results for orders placed during the hospital encounter of 10/29/12 (from the past 24 hour(s))  URINALYSIS, ROUTINE W REFLEX MICROSCOPIC     Status: None   Collection Time    10/29/12  4:02 PM      Result Value Range   Color, Urine YELLOW  YELLOW   APPearance CLEAR  CLEAR   Specific Gravity, Urine 1.025  1.005 - 1.030   pH 6.0  5.0 - 8.0   Glucose, UA NEGATIVE  NEGATIVE mg/dL   Hgb urine dipstick NEGATIVE  NEGATIVE   Bilirubin Urine NEGATIVE  NEGATIVE   Ketones, ur NEGATIVE  NEGATIVE mg/dL   Protein, ur NEGATIVE  NEGATIVE mg/dL   Urobilinogen, UA 0.2  0.0 - 1.0 mg/dL   Nitrite NEGATIVE  NEGATIVE   Leukocytes, UA NEGATIVE  NEGATIVE  CBC     Status: None   Collection Time    10/29/12  4:26 PM      Result Value Range   WBC  9.1  4.0 - 10.5 K/uL   RBC 4.16  3.87 - 5.11 MIL/uL   Hemoglobin 13.3  12.0 - 15.0 g/dL   HCT 16.1  09.6 - 04.5 %   MCV 94.0  78.0 - 100.0 fL   MCH 32.0  26.0 - 34.0 pg   MCHC 34.0  30.0 - 36.0 g/dL   RDW 40.9  81.1 - 91.4 %   Platelets 307  150 - 400 K/uL   US Ob Comp Less 14 Wks  10/28/2012   *RADIOLOGY REPORT*  Clinical Data: Abdominal pain, positive pregnancy test  OBSTETRIC <14 WK [place] AND TRANSVAGINAL OB US  Technique:  Both transabdominal and transvaginal ultrasound examinations were performed for complete evaluation of the gestation as well as the maternal uterus, adnexal regions, and pelvic cul-de-sac.  Transvaginal technique was performed to assess early pregnancy.  Comparison:  None.  Intrauterine gestational sac:  Visualized/normal in shape. Yolk sac: Visualized Embryo: Visualized Cardiac Activity: Visualized but crown-rump length too small for accurate M-mode measurement of fetal heart rate  CRL: 3  mm  5 w  6 d          [place] EDC: 06/24/13  Maternal uterus/adnexae: The ovaries are normal.  IMPRESSION: Intrauterine gestational sac, yolk sac, and fetal pole with visible cardiac activity, concordant with dates by crown-rump length compared to assigned gestational age by LMP.  No acute abnormality.   Original Report Authenticated By: Christiana Pellant, M.D.   US Ob Transvaginal  10/28/2012   *RADIOLOGY REPORT*  Clinical Data: Abdominal pain, positive pregnancy test  OBSTETRIC <14 WK [place] AND TRANSVAGINAL OB US  Technique:  Both transabdominal and transvaginal ultrasound examinations were performed for complete evaluation of the gestation as well as the maternal uterus, adnexal regions, and pelvic cul-de-sac.  Transvaginal technique was performed to assess early pregnancy.  Comparison:  None.  Intrauterine gestational sac:  Visualized/normal in shape. Yolk sac: Visualized Embryo: Visualized Cardiac Activity: Visualized but crown-rump length too small for accurate M-mode measurement of fetal heart rate   CRL: 3  mm  5 w  6 d          [place] EDC: 06/24/13  Maternal uterus/adnexae: The ovaries are normal.  IMPRESSION: Intrauterine gestational sac, yolk sac, and fetal pole with visible cardiac activity, concordant with dates by crown-rump length compared to assigned gestational age by LMP.  No acute abnormality.   Original Report Authenticated By: Christiana Pellant, M.D.   US Abdomen Limited  10/29/2012   *RADIOLOGY REPORT*  Clinical Data: 32 year old female with right lower quadrant abdominal pain.  The patient is pregnant with  gestation of 6 weeks estimated age.  LIMITED ABDOMINAL ULTRASOUND  Comparison:  None  Findings: No abnormalities are identified within the right lower quadrant of the abdomen. The appendix is not visualized. A 2.2 cm right ovarian cyst is present.  IMPRESSION: Appendix not visualized.  Please note that this does not exclude appendicitis.  2.2 cm right ovarian cyst.   Original Report Authenticated By: Harmon Pier, M.D.     MAU Course  Procedures  MDM 1 mg dilaudid IM for pain control Abdominal US, UA and CBC today  Assessment and Plan   Tammy Robinson 10/29/2012, 3:14 PM   MDM Reviewed labs and [place] results with Dr. Erin Fulling. Ok for discharge with precautions.   A: Abdominal pain in pregnancy Nausea and vomiting in pregnancy  P: Discharge home Rx for percocet, Phenergan and Zofran given to patient Warning signs for appendicitis reviewed Patient encouraged to keep appointment to start care with [facility] next week Pregnancy confrimation letter given Patient may return to MAU as needed or if her condition were to change or worsen  Freddi Starr, PA-C 10/29/2012 5:05 PM   I have seen and evaluated the patient with the PA student. I agree with the assessment and plan as written above.   Freddi Starr, PA-C  10/29/2012 6:09 PM

## 2012-10-29 NOTE — MAU Note (Signed)
Was seen yesterday at [facility] because of N/V, was not having right lower abdominal pain at the time found out pregnancy 6 weeks, pain is intermittent every minute or so, denies vaginal bleeding, had ultrasound at [facility] which showed IUP.

## 2012-12-05 ENCOUNTER — Encounter (HOSPITAL_COMMUNITY): Payer: Self-pay

## 2012-12-05 ENCOUNTER — Inpatient Hospital Stay (HOSPITAL_COMMUNITY)
Admission: AD | Admit: 2012-12-05 | Discharge: 2012-12-06 | Disposition: A | Discharge status: Home / Self Care | Payer: Medicaid Other | Source: Ambulatory Visit | Attending: Obstetrics and Gynecology | Admitting: Obstetrics and Gynecology

## 2012-12-05 DIAGNOSIS — O209 Hemorrhage in early pregnancy, unspecified: Secondary | ICD-10-CM | POA: Insufficient documentation

## 2012-12-05 DIAGNOSIS — R109 Unspecified abdominal pain: Secondary | ICD-10-CM | POA: Insufficient documentation

## 2012-12-05 HISTORY — DX: Essential (primary) hypertension: I10

## 2012-12-05 HISTORY — DX: Anxiety disorder, unspecified: F41.9

## 2012-12-05 HISTORY — DX: Encounter for other specified aftercare: Z51.89

## 2012-12-05 LAB — URINALYSIS, ROUTINE W REFLEX MICROSCOPIC
Glucose, UA: NEGATIVE mg/dL
Ketones, ur: NEGATIVE mg/dL
Leukocytes, UA: NEGATIVE
pH: 6 (ref 5.0–8.0)

## 2012-12-05 LAB — URINE MICROSCOPIC-ADD ON

## 2012-12-05 NOTE — MAU Note (Signed)
Pt reports EDC 06/24/2013, lower abd cramping x 4 hours, vaginal bleeding x 30 minutes. States it is heavier than her normal period and she is passing large clots.

## 2012-12-06 ENCOUNTER — Inpatient Hospital Stay (HOSPITAL_COMMUNITY): Payer: Medicaid Other

## 2012-12-06 LAB — WET PREP, GENITAL: Yeast Wet Prep HPF POC: NONE SEEN

## 2012-12-06 MED ORDER — OXYCODONE-ACETAMINOPHEN 5-325 MG PO TABS
2.0000 | ORAL_TABLET | Freq: Once | ORAL | Status: AC
  Administered 2012-12-06: 2 via ORAL
  Filled 2012-12-06: qty 2

## 2012-12-06 MED ORDER — OXYCODONE-ACETAMINOPHEN 5-325 MG PO TABS: 1.0000 | ORAL_TABLET | Freq: Four times a day (QID) | ORAL | Status: DC | PRN

## 2012-12-06 NOTE — MAU Provider Note (Signed)
History     CSN: [id number]  Arrival date and time: 12/05/12 2318   First Provider Initiated Contact with Patient 12/05/12 2352      Chief Complaint  Patient presents with  . Vaginal Bleeding  . Abdominal Pain   HPI Comments: Pt is a 32yo [id number] at [redacted]w[redacted]d that arrives unannounced to MAU w c/o vaginal bleeding that started about 2030 and cramping, pain scale 7/10. She reports ongoing morning sickness, and takes phenergan, denies N/V currently. Pt has initiated care at [facility] (has had had OB interview) unsure when appt is for NOB w/u. Denies recent IC. Denies any abdominal trauma or falls. Denies taking anything for pain recently, (was seen in MAU 6/6 by faculty provider and given RX for percocet due to abdominal pain. [place] at that time showed crl approx 6wks w EDC 1/30. THis is the first time she's had vaginal bleeding. Denies any hx of STD's. Denies any previous pregnancy complications. Admit to hx anxiety and previously took xanax but hasn't taken since finding out she was pregnant in June. Reports hx of high BP and has taken meds in the past, not sure when last taken, can't remember primary care dr's name. Denies any hx of ETOH or illicit drugs, admits to smoking 1/2 PPD. Lives alone w 3 children. IS unemployed   Vaginal Bleeding Associated symptoms include abdominal pain. Pertinent negatives include no dysuria.  Abdominal Pain Pertinent negatives include no dysuria.      Past Medical History  Diagnosis Date  . Blood transfusion without reported diagnosis 2008    post partum hemorrhage  . Anxiety   . Hypertension     Past Surgical History  Procedure Laterality Date  . Cholecystectomy    . Dilation and curettage of uterus      History reviewed. No pertinent family history.  History  Substance Use Topics  . Smoking status: Current Every Day Smoker -- 0.50 packs/day    Types: Cigarettes  . Smokeless tobacco: Not on file  . Alcohol Use: No    Allergies:  Allergies   Allergen Reactions  . Amoxicillin Hives  . Penicillins Hives    Prescriptions prior to admission  Medication Sig Dispense Refill  . albuterol (PROVENTIL HFA;VENTOLIN HFA) 108 (90 BASE) MCG/ACT inhaler Inhale 2 puffs into the lungs every 4 (four) hours as needed for wheezing or shortness of breath.      Marland Kitchen omeprazole (PRILOSEC) 20 MG capsule Take 20 mg by mouth daily.      Marland Kitchen oxyCODONE-acetaminophen (PERCOCET/ROXICET) 5-325 MG per tablet Take 1-2 tablets by mouth every 6 (six) hours as needed for pain.  15 tablet  0  . promethazine (PHENERGAN) 25 MG tablet Take 1 tablet (25 mg total) by mouth every 6 (six) hours as needed for nausea.  30 tablet  0  . citalopram (CELEXA) 20 MG tablet Take 20 mg by mouth daily.      . ondansetron (ZOFRAN) 4 MG tablet Take 1 tablet (4 mg total) by mouth every 6 (six) hours.  12 tablet  0    Review of Systems  Gastrointestinal: Positive for abdominal pain.  Genitourinary: Positive for vaginal bleeding. Negative for dysuria.       Vaginal bleeding   Psychiatric/Behavioral: The patient is nervous/anxious.    Physical Exam   Blood pressure 125/77, pulse 104, temperature 97.9 F (36.6 C), temperature source Oral, resp. rate 18, height 5\' 3"  (1.6 m), weight 153 lb (69.4 kg), last menstrual period 09/17/2012, SpO2 100.00%.  Physical Exam  Nursing note and vitals reviewed. Constitutional: She is oriented to person, place, and time. She appears well-developed and well-nourished.  HENT:  Head: Normocephalic.  Eyes: Pupils are equal, round, and reactive to light.  Neck: Normal range of motion.  Cardiovascular: Normal rate, regular rhythm and normal heart sounds.   Respiratory: Effort normal and breath sounds normal.  GI: Soft. Bowel sounds are normal.  Genitourinary: Vagina normal.  Spec exam, sm amt BRB in vault, 1/2 saturated fox swab, cervix closed/long/high, firm Wet prep and cx collected   Musculoskeletal: Normal range of motion.  Neurological: She is  alert and oriented to person, place, and time. She has normal reflexes.  Skin: Skin is warm and dry.  Psychiatric: She has a normal mood and affect. Her behavior is normal.   US Ob Comp Less 14 Wks  12/06/2012   *RADIOLOGY REPORT*  Clinical Data: Vaginal bleeding and pelvic cramping.  OBSTETRIC <14 WK ULTRASOUND  Technique:  Transabdominal ultrasound was performed for evaluation of the gestation as well as the maternal uterus and adnexal regions.  Comparison:  Pelvic ultrasound performed 10/28/2012  Intrauterine gestational sac: Visualized/normal in shape. Yolk sac: No Embryo: Yes Cardiac Activity: Yes Heart Rate: 170 bpm  CRL:  48.4 mm  11 w  5 d            [place] EDC: 06/22/2013  Maternal uterus/Adnexae: A small amount of subchorionic hemorrhage is noted.  The uterus otherwise unremarkable in appearance.  The subchorionic hemorrhage is seen posterior to the gestational sac, measuring 4.3 x 0.9 x 4.1 cm.  The ovaries are unremarkable in appearance.  The right ovary measures 4.9 x 2.3 x 2.8 cm, while the left ovary measures 2.6 x 1.6 x 1.3 cm.  No suspicious adnexal masses are seen; there is no evidence for ovarian torsion.  No free fluid is seen in the pelvic cul-de-sac.  IMPRESSION:  1.  Single live intrauterine pregnancy noted, with a crown-rump length of 4.8 cm, corresponding to a gestational age of [redacted] weeks 5 days.  This matches the gestational age of [redacted] weeks 3 days by the first ultrasound, reflecting an estimated date of delivery of June 24, 2013. 2.  Small amount of subchorionic hemorrhage noted.   Original Report Authenticated By: Tonia Ghent, M.D.     MAU Course  Procedures Spec exam Wet prep GC/CT Percocet PO  [place]    Assessment and Plan  IUP at [redacted]w[redacted]d Vaginal bleeding, blood type  Opos  Wet prep neg GC/CT pending US shows IUP w small [facility] Pt feels relief of pain after percocet   dc'd home in stable condition rv'd bleeding precautions  RX percocet 5/235mg  PO #15 w 0RF  Will have  office call pt tmrw to schedule NOB  D/w Dr Hansel Starling M 12/06/2012, 1:11 AM

## 2012-12-17 ENCOUNTER — Inpatient Hospital Stay (HOSPITAL_COMMUNITY)
Admission: AD | Admit: 2012-12-17 | Discharge: 2012-12-17 | Disposition: A | Discharge status: Home / Self Care | Payer: Medicaid Other | Source: Ambulatory Visit | Attending: Obstetrics and Gynecology | Admitting: Obstetrics and Gynecology

## 2012-12-17 DIAGNOSIS — R000 Tachycardia, unspecified: Secondary | ICD-10-CM | POA: Insufficient documentation

## 2012-12-17 DIAGNOSIS — O200 Threatened abortion: Secondary | ICD-10-CM | POA: Insufficient documentation

## 2012-12-17 DIAGNOSIS — N949 Unspecified condition associated with female genital organs and menstrual cycle: Secondary | ICD-10-CM | POA: Insufficient documentation

## 2012-12-17 LAB — URINALYSIS, ROUTINE W REFLEX MICROSCOPIC
Glucose, UA: NEGATIVE mg/dL
Specific Gravity, Urine: 1.02 (ref 1.005–1.030)
pH: 6.5 (ref 5.0–8.0)

## 2012-12-17 LAB — URINE MICROSCOPIC-ADD ON

## 2012-12-17 MED ORDER — IBUPROFEN 600 MG PO TABS
600.0000 mg | ORAL_TABLET | Freq: Once | ORAL | Status: AC
  Administered 2012-12-17: 22:00:00 via ORAL
  Filled 2012-12-17 (×2): qty 1

## 2012-12-17 MED ORDER — OXYCODONE-ACETAMINOPHEN 5-325 MG PO TABS: 1.0000 | ORAL_TABLET | ORAL | Status: DC | PRN

## 2012-12-17 MED ORDER — IBUPROFEN 200 MG PO TABS: 600.0000 mg | ORAL_TABLET | Freq: Four times a day (QID) | ORAL | Status: DC | PRN

## 2012-12-17 MED ORDER — OXYCODONE-ACETAMINOPHEN 5-325 MG PO TABS
2.0000 | ORAL_TABLET | Freq: Once | ORAL | Status: AC
  Administered 2012-12-17: 2 via ORAL
  Filled 2012-12-17: qty 2

## 2012-12-17 NOTE — MAU Provider Note (Signed)
History     CSN: [id number]  Arrival date and time: 12/17/12 1944   None     Chief Complaint  Patient presents with  . Vaginal Bleeding  . Abdominal Pain   HPI Comments: Pt is a G5P3013 at 13wks that arrives unannounced w c/o pelvic cramping and vag bleeding. (has known [facility]). Seen in MAU on 7/13 for vag bleeding and cramping, dc'd home w percocet #15. Pt states she's not tried anything for the pain today. Vag bleeding had improved and then returned this morning, had BRB on pad. Has not had IC. Pt initially refused motrin, stated to RN she wanted percocet. (I was attending a delivery) I then returned to pt's room and rv'd benefit of taking motrin, and discussed chronic use of narcotics during pregnancy, pt then agreed to take motrin. PT states she had tried motrin in the past and it didn't work. Also rv'd only taking motrin in 2nd trimester.      Past Medical History  Diagnosis Date  . Blood transfusion without reported diagnosis 2008    post partum hemorrhage  . Anxiety   . Hypertension     Past Surgical History  Procedure Laterality Date  . Cholecystectomy    . Dilation and curettage of uterus      No family history on file.  History  Substance Use Topics  . Smoking status: Current Every Day Smoker -- 0.50 packs/day    Types: Cigarettes  . Smokeless tobacco: Not on file  . Alcohol Use: No    Allergies:  Allergies  Allergen Reactions  . Amoxicillin Hives  . Other Hives    All -Cillins  . Penicillins Hives    Prescriptions prior to admission  Medication Sig Dispense Refill  . albuterol (PROVENTIL HFA;VENTOLIN HFA) 108 (90 BASE) MCG/ACT inhaler Inhale 2 puffs into the lungs every 4 (four) hours as needed for wheezing or shortness of breath.      Marland Kitchen omeprazole (PRILOSEC) 20 MG capsule Take 20 mg by mouth daily.      . Prenatal Vit-Fe Fumarate-FA (PRENATAL MULTIVITAMIN) TABS Take 1 tablet by mouth every morning.      . promethazine (PHENERGAN) 25 MG tablet Take 1  tablet (25 mg total) by mouth every 6 (six) hours as needed for nausea.  30 tablet  0    Review of Systems  All other systems reviewed and are negative.   Physical Exam   Blood pressure 130/84, pulse 136, temperature 98.8 F (37.1 C), resp. rate 18, height 5\' 3"  (1.6 m), weight 155 lb 12.8 oz (70.67 kg), last menstrual period 09/17/2012, SpO2 100.00%.  Physical Exam  Nursing note and vitals reviewed. Constitutional: She is oriented to person, place, and time. She appears well-developed and well-nourished. She appears distressed.  Initially in NAD, upon my return to her room, pt holding abdomen, tearful, rocking on her side   HENT:  Head: Normocephalic.  Eyes: Pupils are equal, round, and reactive to light.  Neck: Normal range of motion.  Cardiovascular: Normal rate.   Tachycardia without other symptoms, (pt admits to smoking immediately prior to arrival)   Respiratory: Effort normal.  GI: Soft. Bowel sounds are normal. She exhibits no distension. There is tenderness. There is guarding.  Genitourinary:  Pad that was on since arrival, had scant brown smear  Deferred   Musculoskeletal: Normal range of motion.  Neurological: She is alert and oriented to person, place, and time. She has normal reflexes.  Skin: Skin is warm and dry.  Psychiatric: She has a normal mood and affect. Her behavior is normal.   FHT's 141 per doppler   MAU Course  Procedures    Assessment and Plan  IUP at 13wks Known [facility] Threatened miscarriage Tachycardia (consider cardiac w/u if tachycardia persists)   Pain improved w motrin 600mg  and 2 tabs percocet Dc home w miscarriage precautions, keep scheduled f/u at office RX motrin 600mg  PO #30, q6h ATC x48hrs, then prn Percocet 5/325mg  PO #15 q4h prn  rv'd smoking cessation   Meda Dudzinski M 12/17/2012, 10:16 PM

## 2012-12-17 NOTE — MAU Note (Signed)
Was seen here about 2 wks ago and told had subchorionic hemorrhage. Has spotted off and on but today alittle more and having severe abd cramping

## 2012-12-30 ENCOUNTER — Inpatient Hospital Stay (HOSPITAL_COMMUNITY): Payer: Medicaid Other | Admitting: Anesthesiology

## 2012-12-30 ENCOUNTER — Inpatient Hospital Stay (HOSPITAL_COMMUNITY)
Admission: AD | Admit: 2012-12-30 | Discharge: 2012-12-30 | Disposition: A | Discharge status: Home / Self Care | Payer: Medicaid Other | Source: Ambulatory Visit | Attending: Obstetrics and Gynecology | Admitting: Obstetrics and Gynecology

## 2012-12-30 ENCOUNTER — Encounter (HOSPITAL_COMMUNITY)
Admission: AD | Disposition: A | Discharge status: Home / Self Care | Payer: Self-pay | Source: Ambulatory Visit | Attending: Obstetrics and Gynecology

## 2012-12-30 ENCOUNTER — Encounter (HOSPITAL_COMMUNITY): Payer: Self-pay | Admitting: Anesthesiology

## 2012-12-30 ENCOUNTER — Encounter (HOSPITAL_COMMUNITY): Payer: Self-pay | Admitting: *Deleted

## 2012-12-30 ENCOUNTER — Inpatient Hospital Stay (HOSPITAL_COMMUNITY): Payer: Medicaid Other

## 2012-12-30 DIAGNOSIS — O0289 Other abnormal products of conception: Secondary | ICD-10-CM | POA: Diagnosis present

## 2012-12-30 DIAGNOSIS — O021 Missed abortion: Secondary | ICD-10-CM | POA: Insufficient documentation

## 2012-12-30 HISTORY — DX: Headache: R51

## 2012-12-30 HISTORY — DX: Unspecified infectious disease: B99.9

## 2012-12-30 HISTORY — PX: DILATION AND EVACUATION: SHX1459

## 2012-12-30 HISTORY — DX: Unspecified ovarian cyst, unspecified side: N83.209

## 2012-12-30 LAB — CBC
MCHC: 34.9 g/dL (ref 30.0–36.0)
Platelets: 286 10*3/uL (ref 150–400)
RDW: 13.3 % (ref 11.5–15.5)
WBC: 18.4 10*3/uL — ABNORMAL HIGH (ref 4.0–10.5)

## 2012-12-30 SURGERY — DILATION AND EVACUATION, UTERUS, SECOND TRIMESTER
Anesthesia: Monitor Anesthesia Care | Site: Vagina | Wound class: Clean Contaminated

## 2012-12-30 MED ORDER — LIDOCAINE HCL (CARDIAC) 20 MG/ML IV SOLN
INTRAVENOUS | Status: DC | PRN
  Administered 2012-12-30: 40 mg via INTRAVENOUS

## 2012-12-30 MED ORDER — FENTANYL CITRATE 0.05 MG/ML IJ SOLN
25.0000 ug | INTRAMUSCULAR | Status: DC | PRN
  Administered 2012-12-30: 50 ug via INTRAVENOUS

## 2012-12-30 MED ORDER — FENTANYL CITRATE 0.05 MG/ML IJ SOLN
INTRAMUSCULAR | Status: DC | PRN
  Administered 2012-12-30 (×2): 100 ug via INTRAVENOUS
  Administered 2012-12-30 (×3): 50 ug via INTRAVENOUS

## 2012-12-30 MED ORDER — DEXAMETHASONE SODIUM PHOSPHATE 10 MG/ML IJ SOLN
INTRAMUSCULAR | Status: DC | PRN
  Administered 2012-12-30: 5 mg via INTRAVENOUS

## 2012-12-30 MED ORDER — PROPOFOL 10 MG/ML IV EMUL
INTRAVENOUS | Status: DC | PRN
  Administered 2012-12-30 (×14): 50 mg via INTRAVENOUS

## 2012-12-30 MED ORDER — DOXYCYCLINE HYCLATE 100 MG IV SOLR
100.0000 mg | INTRAVENOUS | Status: AC
  Administered 2012-12-30: 100 mg via INTRAVENOUS
  Filled 2012-12-30: qty 100

## 2012-12-30 MED ORDER — LIDOCAINE HCL 2 % IJ SOLN
INTRAMUSCULAR | Status: DC | PRN
  Administered 2012-12-30: 10 mL

## 2012-12-30 MED ORDER — FAMOTIDINE IN NACL 20-0.9 MG/50ML-% IV SOLN
20.0000 mg | INTRAVENOUS | Status: AC
  Administered 2012-12-30: 20 mg via INTRAVENOUS
  Filled 2012-12-30: qty 50

## 2012-12-30 MED ORDER — LACTATED RINGERS IV SOLN
INTRAVENOUS | Status: DC
  Administered 2012-12-30 (×2): via INTRAVENOUS

## 2012-12-30 MED ORDER — IBUPROFEN 600 MG PO TABS: 600.0000 mg | ORAL_TABLET | Freq: Four times a day (QID) | ORAL | Status: DC | PRN

## 2012-12-30 MED ORDER — KETOROLAC TROMETHAMINE 30 MG/ML IJ SOLN
INTRAMUSCULAR | Status: DC | PRN
  Administered 2012-12-30: 30 mg via INTRAVENOUS

## 2012-12-30 MED ORDER — OXYCODONE-ACETAMINOPHEN 5-325 MG PO TABS: 1.0000 | ORAL_TABLET | Freq: Four times a day (QID) | ORAL | Status: DC | PRN

## 2012-12-30 MED ORDER — FENTANYL CITRATE 0.05 MG/ML IJ SOLN
INTRAMUSCULAR | Status: AC
  Administered 2012-12-30: 50 ug via INTRAVENOUS
  Filled 2012-12-30: qty 2

## 2012-12-30 MED ORDER — ONDANSETRON HCL 4 MG/2ML IJ SOLN
INTRAMUSCULAR | Status: DC | PRN
  Administered 2012-12-30: 4 mg via INTRAVENOUS

## 2012-12-30 MED ORDER — MIDAZOLAM HCL 5 MG/5ML IJ SOLN
INTRAMUSCULAR | Status: DC | PRN
  Administered 2012-12-30: 2 mg via INTRAVENOUS

## 2012-12-30 MED ORDER — METRONIDAZOLE IN NACL 5-0.79 MG/ML-% IV SOLN
500.0000 mg | INTRAVENOUS | Status: AC
  Administered 2012-12-30: 500 g via INTRAVENOUS
  Filled 2012-12-30: qty 100

## 2012-12-30 SURGICAL SUPPLY — 5 items
GOWN SURGICAL XLG (GOWNS) ×4 IMPLANT
KIT BERKELEY 2ND TRIMESTER 1/2 (COLLECTOR) ×2 IMPLANT
PACK VAGINAL MINOR WOMEN LF (CUSTOM PROCEDURE TRAY) ×2 IMPLANT
TOWEL OR 17X24 6PK STRL BLUE (TOWEL DISPOSABLE) ×4 IMPLANT
TUBE VACURETTE 2ND TRIMESTER (CANNULA) ×2 IMPLANT
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
## 2012-12-30 NOTE — Progress Notes (Signed)
JOxley, CNM notified that pt is asking about plan. CNM to discuss with Dr. Su Hilt and call back with further instructions.

## 2012-12-30 NOTE — MAU Note (Signed)
JOxley, CNM notified iv fluids running, CNM unsure if abx or anesthesia meds are necessary. RN to call OR

## 2012-12-30 NOTE — MAU Note (Signed)
Per Dr. Jean Rosenthal, anesthesia, only pre-op standing orders are necessary.

## 2012-12-30 NOTE — Op Note (Signed)
Preop Diagnosis: bleeding   Postop Diagnosis: bleeding   Procedure: DILATATION AND EVACUATION D AND E 2ND TRIMESTER with ULTRASOUND GUIDANCE  Anesthesia: Monitor Anesthesia Care   Anesthesiologist: Cristela Blue, MD  Attending: Purcell Nails, MD   Assistant: N/a  Findings: Large amount of POCs.  Uterine cavity appeared to be empty at the end of the case by ultrasound evaluation.  Pathology: POCs  Fluids: 1100 cc  UOP: QS via straight cath prior to procedure  EBL: 150 cc  Complications: None  Procedure: The patient was taken to the operating room after the risks benefits and alternatives were discussed with the patient, the patient verbalized understanding and consent signed and witnessed.  A TIME OUT was performed.  The patient was placed under MAC anesthesia, prepped and draped in the normal sterile fashion and a time out was performed.  A bivalve speculum was placed in the patient's vagina and the anterior lip of the cervix grasped with a single-tooth tenaculum. A paracervical block was administered using a total of 10 cc of 2% lidocaine.  A size 14 suction curette was used. Suction curettage was performed until minimal tissue returned. Bear forceps was used to extract remaining tissue.  Sharp curettage was performed until a gritty texture was noted. Suction curettage was performed once again to remove any remaining debris. All instruments were removed. The count was correct. The patient was transferred to the recovery room in good condition.

## 2012-12-30 NOTE — MAU Note (Signed)
Went to dr today, no FH on [place]. Sent over for futher eval, ? D&C per Dr Su Hilt.
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
## 2012-12-30 NOTE — MAU Note (Signed)
Pt awaiting Dr. Su Hilt.

## 2012-12-30 NOTE — Transfer of Care (Signed)
Immediate Anesthesia Transfer of Care Note  Patient: Tammy Robinson  Procedure(s) Performed: Procedure(s): DILATATION AND EVACUATION (D&E) 2ND TRIMESTER (N/A)  Patient Location: PACU  Anesthesia Type:MAC  Level of Consciousness: awake, alert , oriented and patient cooperative  Airway & Oxygen Therapy: Patient Spontanous Breathing and Patient connected to nasal cannula oxygen  Post-op Assessment: Report given to PACU RN and Post -op Vital signs reviewed and stable  Post vital signs: Reviewed and stable  Complications: No apparent anesthesia complications

## 2012-12-30 NOTE — Anesthesia Preprocedure Evaluation (Signed)
Anesthesia Evaluation  Patient identified by MRN, date of birth, ID band Patient awake    Reviewed: Allergy & Precautions, H&P , Patient's Chart, lab work & pertinent test results, reviewed documented beta blocker date and time   Airway Mallampati: II TM Distance: >3 FB Neck ROM: full    Dental no notable dental hx.    Pulmonary  breath sounds clear to auscultation  Pulmonary exam normal       Cardiovascular Rhythm:regular Rate:Normal     Neuro/Psych    GI/Hepatic   Endo/Other    Renal/GU      Musculoskeletal   Abdominal   Peds  Hematology   Anesthesia Other Findings   Reproductive/Obstetrics                           Anesthesia Physical Anesthesia Plan  ASA: II  Anesthesia Plan: MAC   Post-op Pain Management:    Induction: Intravenous  Airway Management Planned: LMA, Mask and Natural Airway  Additional Equipment:   Intra-op Plan:   Post-operative Plan:   Informed Consent: I have reviewed the patients History and Physical, chart, labs and discussed the procedure including the risks, benefits and alternatives for the proposed anesthesia with the patient or authorized representative who has indicated his/her understanding and acceptance.   Dental Advisory Given  Plan Discussed with: CRNA and Surgeon  Anesthesia Plan Comments:         Anesthesia Quick Evaluation

## 2012-12-31 NOTE — Anesthesia Postprocedure Evaluation (Signed)
  Anesthesia Post-op Note  Patient: Tammy Robinson  Procedure(s) Performed: Procedure(s): DILATATION AND EVACUATION (D&E) 2ND TRIMESTER (N/A) Patient is awake and responsive. Pain and nausea are reasonably well controlled. Vital signs are stable and clinically acceptable. Oxygen saturation is clinically acceptable. There are no apparent anesthetic complications at this time. Patient is ready for discharge.

## 2013-01-02 LAB — TYPE AND SCREEN
Antibody Screen: POSITIVE
Donor AG Type: NEGATIVE
Donor AG Type: NEGATIVE
Unit division: 0

## 2013-01-07 ENCOUNTER — Inpatient Hospital Stay (HOSPITAL_COMMUNITY)
Admission: AD | Admit: 2013-01-07 | Discharge: 2013-01-07 | Disposition: A | Discharge status: Home / Self Care | Payer: Medicaid Other | Source: Ambulatory Visit | Attending: Obstetrics and Gynecology | Admitting: Obstetrics and Gynecology

## 2013-01-07 ENCOUNTER — Encounter (HOSPITAL_COMMUNITY): Payer: Self-pay

## 2013-01-07 DIAGNOSIS — I10 Essential (primary) hypertension: Secondary | ICD-10-CM | POA: Insufficient documentation

## 2013-01-07 DIAGNOSIS — O09299 Supervision of pregnancy with other poor reproductive or obstetric history, unspecified trimester: Secondary | ICD-10-CM

## 2013-01-07 DIAGNOSIS — F17200 Nicotine dependence, unspecified, uncomplicated: Secondary | ICD-10-CM | POA: Diagnosis present

## 2013-01-07 DIAGNOSIS — G43909 Migraine, unspecified, not intractable, without status migrainosus: Secondary | ICD-10-CM | POA: Diagnosis not present

## 2013-01-07 DIAGNOSIS — F411 Generalized anxiety disorder: Secondary | ICD-10-CM | POA: Diagnosis not present

## 2013-01-07 DIAGNOSIS — O10919 Unspecified pre-existing hypertension complicating pregnancy, unspecified trimester: Secondary | ICD-10-CM | POA: Diagnosis present

## 2013-01-07 DIAGNOSIS — O021 Missed abortion: Secondary | ICD-10-CM | POA: Insufficient documentation

## 2013-01-07 LAB — COMPREHENSIVE METABOLIC PANEL
AST: 14 U/L (ref 0–37)
Albumin: 3.2 g/dL — ABNORMAL LOW (ref 3.5–5.2)
BUN: 6 mg/dL (ref 6–23)
CO2: 26 mEq/L (ref 19–32)
Calcium: 9.1 mg/dL (ref 8.4–10.5)
Creatinine, Ser: 0.74 mg/dL (ref 0.50–1.10)
GFR calc non Af Amer: >90 mL/min (ref >90)

## 2013-01-07 LAB — CBC WITH DIFFERENTIAL/PLATELET
Basophils Absolute: 0 10*3/uL (ref 0.0–0.1)
Basophils Relative: 0 % (ref 0–1)
Eosinophils Relative: 5 % (ref 0–5)
HCT: 36.8 % (ref 36.0–46.0)
MCHC: 34 g/dL (ref 30.0–36.0)
MCV: 92.9 fL (ref 78.0–100.0)
Monocytes Absolute: 0.6 10*3/uL (ref 0.1–1.0)
Monocytes Relative: 4 % (ref 3–12)
RDW: 13.7 % (ref 11.5–15.5)

## 2013-01-07 MED ORDER — KETOROLAC TROMETHAMINE 30 MG/ML IJ SOLN
30.0000 mg | Freq: Once | INTRAMUSCULAR | Status: AC
  Administered 2013-01-07: 30 mg via INTRAVENOUS
  Filled 2013-01-07: qty 1

## 2013-01-07 MED ORDER — HYDROMORPHONE HCL 2 MG PO TABS: ORAL_TABLET | ORAL | Status: DC

## 2013-01-07 MED ORDER — MISOPROSTOL 200 MCG PO TABS
800.0000 ug | ORAL_TABLET | Freq: Once | ORAL | Status: AC
  Administered 2013-01-07: 800 ug via RECTAL
  Filled 2013-01-07: qty 4

## 2013-01-07 MED ORDER — LABETALOL HCL 200 MG PO TABS: 200.0000 mg | ORAL_TABLET | Freq: Two times a day (BID) | ORAL | Status: DC

## 2013-01-07 MED ORDER — LABETALOL HCL 100 MG PO TABS
200.0000 mg | ORAL_TABLET | Freq: Two times a day (BID) | ORAL | Status: DC
  Administered 2013-01-07: 200 mg via ORAL
  Filled 2013-01-07: qty 2

## 2013-01-07 MED ORDER — HYDROMORPHONE HCL 2 MG PO TABS
2.0000 mg | ORAL_TABLET | Freq: Once | ORAL | Status: AC
  Administered 2013-01-07: 2 mg via ORAL
  Filled 2013-01-07: qty 1

## 2013-01-07 NOTE — MAU Note (Signed)
Good family support.  Had broke up with FOB, he had gone back with ex, he had still be calling and involved with preg, but now that she has lost the preg, he is less involved, has been an Comptroller
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
## 2013-01-07 NOTE — MAU Note (Signed)
Hard copy sign obtained for d/c instructions, esign not working.

## 2013-01-07 NOTE — MAU Note (Signed)
Patient had a D & E on 8-7. States she had a little bleeding after for a few days. Started bleeding heavier yesterday and this morning passed 2 "huge" clots with blood running down legs. Having a lot of pain since last night.

## 2013-01-07 NOTE — MAU Note (Signed)
Heavy bleeding this morning, pain is worse today. Taking pain meds she was prescribed, not helping.

## 2013-01-14 ENCOUNTER — Inpatient Hospital Stay (HOSPITAL_COMMUNITY)
Admission: AD | Admit: 2013-01-14 | Discharge: 2013-01-14 | Disposition: A | Discharge status: Home / Self Care | Payer: Medicaid Other | Source: Ambulatory Visit | Attending: Obstetrics and Gynecology | Admitting: Obstetrics and Gynecology

## 2013-01-14 ENCOUNTER — Encounter (HOSPITAL_COMMUNITY): Payer: Self-pay | Admitting: *Deleted

## 2013-01-14 DIAGNOSIS — R109 Unspecified abdominal pain: Secondary | ICD-10-CM | POA: Insufficient documentation

## 2013-01-14 LAB — URINE MICROSCOPIC-ADD ON

## 2013-01-14 LAB — CBC
HCT: 39.3 % (ref 36.0–46.0)
Hemoglobin: 13.1 g/dL (ref 12.0–15.0)
MCV: 94.9 fL (ref 78.0–100.0)
RBC: 4.14 MIL/uL (ref 3.87–5.11)
RDW: 14.3 % (ref 11.5–15.5)
WBC: 8.5 10*3/uL (ref 4.0–10.5)

## 2013-01-14 LAB — URINALYSIS, ROUTINE W REFLEX MICROSCOPIC
Glucose, UA: NEGATIVE mg/dL
Leukocytes, UA: NEGATIVE
Protein, ur: NEGATIVE mg/dL
pH: 7.5 (ref 5.0–8.0)

## 2013-01-14 MED ORDER — KETOROLAC TROMETHAMINE 60 MG/2ML IM SOLN
60.0000 mg | Freq: Once | INTRAMUSCULAR | Status: AC
  Administered 2013-01-14: 60 mg via INTRAMUSCULAR
  Filled 2013-01-14: qty 2

## 2013-01-14 MED ORDER — KETOROLAC TROMETHAMINE 10 MG PO TABS: 10.0000 mg | ORAL_TABLET | Freq: Four times a day (QID) | ORAL | Status: DC | PRN
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
## 2013-01-14 NOTE — Discharge Summary (Signed)
Physician Discharge Summary  Patient ID: MURLINE WEIGEL MRN: [id number] DOB/AGE: [birth year] 32 y.o.  Admit date:         01/14/2013 Discharge date: 01/14/2013  Admission Diagnoses:  Status post dilatation and evacuation for a miscarriage  Abdominal pain   Discharge Diagnoses:   Same  Procedures this Admission:  None  Discharged Condition: stable   Admission Hx and PE: The patient has been followed at the [address] division of [facility]. She has a history of  a dilatation and evacuation or 12/30/2012 because of a miscarriage. She complained of bleeding and pain after her surgery. An ultrasound was performed that showed no retained products of conception. The patient was seen in our office yesterday with the same complaints. Her CBC was normal. She was offered nonsteroidal pain medication. She presents to the emergency department today with the same complaints.  The patient denies fever and chills. She says she has had normal bowel movements.  Labs:  Hemoglobin  Date Value Range Status  01/14/2013 13.1  12.0 - 15.0 g/dL Final     HCT  Date Value Range Status  01/14/2013 39.3  36.0 - 46.0 % Final   CBC    Component Value Date/Time   WBC 8.5 01/14/2013 1323   RBC 4.14 01/14/2013 1323   HGB 13.1 01/14/2013 1323   HCT 39.3 01/14/2013 1323   PLT 299 01/14/2013 1323   MCV 94.9 01/14/2013 1323   MCH 31.6 01/14/2013 1323   MCHC 33.3 01/14/2013 1323   RDW 14.3 01/14/2013 1323   LYMPHSABS 2.0 01/07/2013 1235   MONOABS 0.6 01/07/2013 1235   EOSABS 0.8* 01/07/2013 1235   BASOSABS 0.0 01/07/2013 1235    Urinalysis: Negative  BP 133/85  Pulse 103  Temp(Src) 98.3 F (36.8 C) (Oral)  Resp 16  Ht 5' 1.5" (1.562 m)  Wt 151 lb 2 oz (68.55 kg)  BMI 28.1 kg/m2  LMP 01/10/2013  Breastfeeding? No  HEENT: The patient appears uncomfortable but she says that her pain is better after her IM shot of Toradol. Chest: Clear Heart: Regular rate and  rhythm Abdomen: Soft, voluntary guarding, no rebound Pelvic exam: See note from the provider in maternity admissions  Disposition:  The patient will be discharged to home. She has been given a copy of the discharge instructions as prepared by the [facility] for patients who have pelvic pain . She was given a prescription for Toradol tablets 10 mg. She can take 1 tablet every 4-6 hours as needed for pain. She can call if she does not improve. She will followup with her surgeon as needed.     Medication List    ASK your doctor about these medications       albuterol 108 (90 BASE) MCG/ACT inhaler  Commonly known as:  PROVENTIL HFA;VENTOLIN HFA  Inhale 2 puffs into the lungs every 4 (four) hours as needed for wheezing or shortness of breath.     cyclobenzaprine 5 MG tablet  Commonly known as:  FLEXERIL  Take 5 mg by mouth 3 (three) times daily as needed for muscle spasms.     doxycycline 100 MG capsule  Commonly known as:  VIBRAMYCIN  Take 100 mg by mouth 2 (two) times daily.     HYDROmorphone 2 MG tablet  Commonly known as:  DILAUDID  Take 1-2 tablets every 4 hours as needed for pain.     ibuprofen 600 MG tablet  Commonly known as:  ADVIL  Take 1 tablet (600 mg total) by mouth every 6 (six) hours as needed for pain.     labetalol 200 MG tablet  Commonly known as:  NORMODYNE  Take 1 tablet (200 mg total) by mouth 2 (two) times daily.     metroNIDAZOLE 500 MG tablet  Commonly known as:  FLAGYL  Take 500 mg by mouth every 12 (twelve) hours.     omeprazole 20 MG capsule  Commonly known as:  PRILOSEC  Take 20 mg by mouth daily.     oxyCODONE-acetaminophen 5-325 MG per tablet  Commonly known as:  ROXICET  Take 1 tablet by mouth every 6 (six) hours as needed for pain.     prenatal multivitamin Tabs tablet  Take 1 tablet by mouth every morning.           Follow-up Information   Follow up with Purcell Nails, MD In 2 weeks.   Specialty:  Obstetrics  and Gynecology   Contact information:   [address] [place] [address] [phone]       Signed: Janine Limbo 01/14/2013, 2:22 PM

## 2013-01-14 NOTE — MAU Note (Signed)
Pt states had d and e two weeks ago for miscarriage. Bled slightly for 2 days and stopped. Came in to MAU roughly one week ago and was given cytotec. Bleeding became heavier for first few days and slowed up. Here today for bleeding, notes black blood on pad, however is red when she wipes, still having bad abdominal pain across lower abdomen that is constant. Changing pad hourly.

## 2013-01-15 LAB — URINE CULTURE
Colony Count: NO GROWTH
Culture: NO GROWTH

## 2013-01-16 ENCOUNTER — Inpatient Hospital Stay (HOSPITAL_COMMUNITY)
Admission: AD | Admit: 2013-01-16 | Discharge: 2013-01-17 | Disposition: A | Discharge status: Home / Self Care | Payer: Medicaid Other | Source: Ambulatory Visit | Attending: Obstetrics and Gynecology | Admitting: Obstetrics and Gynecology

## 2013-01-16 ENCOUNTER — Encounter (HOSPITAL_COMMUNITY): Payer: Self-pay | Admitting: *Deleted

## 2013-01-16 ENCOUNTER — Inpatient Hospital Stay (HOSPITAL_COMMUNITY): Payer: Medicaid Other | Admitting: Anesthesiology

## 2013-01-16 ENCOUNTER — Encounter (HOSPITAL_COMMUNITY): Payer: Self-pay | Admitting: Anesthesiology

## 2013-01-16 ENCOUNTER — Inpatient Hospital Stay (HOSPITAL_COMMUNITY): Payer: Medicaid Other

## 2013-01-16 ENCOUNTER — Encounter (HOSPITAL_COMMUNITY)
Admission: AD | Disposition: A | Discharge status: Home / Self Care | Payer: Self-pay | Source: Ambulatory Visit | Attending: Obstetrics and Gynecology

## 2013-01-16 DIAGNOSIS — O021 Missed abortion: Secondary | ICD-10-CM

## 2013-01-16 DIAGNOSIS — O031 Delayed or excessive hemorrhage following incomplete spontaneous abortion: Secondary | ICD-10-CM | POA: Insufficient documentation

## 2013-01-16 HISTORY — PX: DILATION AND EVACUATION: SHX1459

## 2013-01-16 LAB — WET PREP, GENITAL
Clue Cells Wet Prep HPF POC: NONE SEEN
Yeast Wet Prep HPF POC: NONE SEEN

## 2013-01-16 LAB — COMPREHENSIVE METABOLIC PANEL
ALT: 17 U/L (ref 0–35)
AST: 16 U/L (ref 0–37)
Albumin: 3.5 g/dL (ref 3.5–5.2)
Calcium: 9.4 mg/dL (ref 8.4–10.5)
Creatinine, Ser: 0.79 mg/dL (ref 0.50–1.10)
GFR calc non Af Amer: >90 mL/min (ref >90)
Sodium: 140 mEq/L (ref 135–145)
Total Protein: 6.2 g/dL (ref 6.0–8.3)

## 2013-01-16 LAB — CBC WITH DIFFERENTIAL/PLATELET
Basophils Absolute: 0.1 10*3/uL (ref 0.0–0.1)
Basophils Relative: 1 % (ref 0–1)
Eosinophils Relative: 7 % — ABNORMAL HIGH (ref 0–5)
HCT: 38.6 % (ref 36.0–46.0)
Hemoglobin: 12.9 g/dL (ref 12.0–15.0)
MCH: 31.9 pg (ref 26.0–34.0)
MCHC: 33.4 g/dL (ref 30.0–36.0)
MCV: 95.3 fL (ref 78.0–100.0)
Monocytes Absolute: 0.6 10*3/uL (ref 0.1–1.0)
Monocytes Relative: 7 % (ref 3–12)
Neutro Abs: 4.4 10*3/uL (ref 1.7–7.7)
RDW: 13.9 % (ref 11.5–15.5)

## 2013-01-16 LAB — HCG, QUANTITATIVE, PREGNANCY: hCG, Beta Chain, Quant, S: 13 m[IU]/mL — ABNORMAL HIGH (ref <5)

## 2013-01-16 LAB — URINALYSIS, ROUTINE W REFLEX MICROSCOPIC
Bilirubin Urine: NEGATIVE
Ketones, ur: NEGATIVE mg/dL
Nitrite: NEGATIVE
Specific Gravity, Urine: >1.03 — ABNORMAL HIGH (ref 1.005–1.030)
Urobilinogen, UA: 0.2 mg/dL (ref 0.0–1.0)
pH: 6 (ref 5.0–8.0)

## 2013-01-16 LAB — PREPARE RBC (CROSSMATCH)

## 2013-01-16 LAB — URINE MICROSCOPIC-ADD ON

## 2013-01-16 SURGERY — DILATION AND EVACUATION, UTERUS
Anesthesia: Monitor Anesthesia Care | Site: Vagina | Wound class: Contaminated

## 2013-01-16 MED ORDER — GENTAMICIN SULFATE 40 MG/ML IJ SOLN
INTRAMUSCULAR | Status: DC
  Filled 2013-01-16: qty 8.74

## 2013-01-16 MED ORDER — LACTATED RINGERS IV SOLN
INTRAVENOUS | Status: DC
  Administered 2013-01-16 – 2013-01-17 (×3): via INTRAVENOUS

## 2013-01-16 MED ORDER — KETOROLAC TROMETHAMINE 30 MG/ML IJ SOLN
30.0000 mg | Freq: Once | INTRAMUSCULAR | Status: AC
  Administered 2013-01-16: 30 mg via INTRAMUSCULAR
  Filled 2013-01-16: qty 1

## 2013-01-16 MED ORDER — DEXTROSE IN LACTATED RINGERS 5 % IV SOLN: INTRAVENOUS | Status: DC

## 2013-01-16 MED ORDER — GENTAMICIN SULFATE 40 MG/ML IJ SOLN
INTRAVENOUS | Status: DC | PRN
  Administered 2013-01-16: 100 mL via INTRAVENOUS

## 2013-01-16 MED ORDER — LACTATED RINGERS IV SOLN: INTRAVENOUS | Status: DC

## 2013-01-16 SURGICAL SUPPLY — 18 items
CATH ROBINSON RED A/P 16FR (CATHETERS) ×2 IMPLANT
CLOTH BEACON ORANGE TIMEOUT ST (SAFETY) ×2 IMPLANT
DECANTER SPIKE VIAL GLASS SM (MISCELLANEOUS) ×4 IMPLANT
GLOVE BIO SURGEON STRL SZ7 (GLOVE) ×4 IMPLANT
GOWN STRL REIN XL XLG (GOWN DISPOSABLE) ×4 IMPLANT
KIT BERKELEY 1ST TRIMESTER 3/8 (MISCELLANEOUS) ×2 IMPLANT
NEEDLE SPNL 22GX3.5 QUINCKE BK (NEEDLE) ×2 IMPLANT
NS IRRIG 1000ML POUR BTL (IV SOLUTION) ×2 IMPLANT
PACK VAGINAL MINOR WOMEN LF (CUSTOM PROCEDURE TRAY) ×2 IMPLANT
PAD OB MATERNITY 4.3X12.25 (PERSONAL CARE ITEMS) ×2 IMPLANT
PAD PREP 24X48 CUFFED NSTRL (MISCELLANEOUS) ×2 IMPLANT
SET BERKELEY SUCTION TUBING (SUCTIONS) ×2 IMPLANT
SYR CONTROL 10ML LL (SYRINGE) ×2 IMPLANT
TOWEL OR 17X24 6PK STRL BLUE (TOWEL DISPOSABLE) ×4 IMPLANT
VACURETTE 10 RIGID CVD (CANNULA) IMPLANT
VACURETTE 7MM CVD STRL WRAP (CANNULA) IMPLANT
VACURETTE 8 RIGID CVD (CANNULA) IMPLANT
VACURETTE 9 RIGID CVD (CANNULA) ×2 IMPLANT
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
## 2013-01-16 NOTE — MAU Note (Addendum)
D&E (for IUFD) on 08/07. Bleeding has continued, initially small amt, then was passing large clots.  Now it is small clots, but the pain has worsened.

## 2013-01-16 NOTE — Anesthesia Preprocedure Evaluation (Addendum)
Anesthesia Evaluation  Patient identified by MRN, date of birth, ID band Patient awake    Reviewed: Allergy & Precautions, H&P , NPO status , Patient's Chart, lab work & pertinent test results, reviewed documented beta blocker date and time   Airway Mallampati: II TM Distance: >3 FB Neck ROM: full    Dental no notable dental hx.    Pulmonary Current Smoker,  breath sounds clear to auscultation  Pulmonary exam normal       Cardiovascular hypertension, Pt. on medications Rhythm:regular Rate:Normal     Neuro/Psych PSYCHIATRIC DISORDERS Anxiety negative neurological ROS     GI/Hepatic negative GI ROS, Neg liver ROS,   Endo/Other  negative endocrine ROS  Renal/GU negative Renal ROS     Musculoskeletal negative musculoskeletal ROS (+)   Abdominal   Peds  Hematology K antibody   Anesthesia Other Findings   Reproductive/Obstetrics (+) Pregnancy                          Anesthesia Physical  Anesthesia Plan  ASA: II  Anesthesia Plan: MAC   Post-op Pain Management:    Induction: Intravenous  Airway Management Planned: Simple Face Mask and Nasal Cannula  Additional Equipment:   Intra-op Plan:   Post-operative Plan:   Informed Consent: I have reviewed the patients History and Physical, chart, labs and discussed the procedure including the risks, benefits and alternatives for the proposed anesthesia with the patient or authorized representative who has indicated his/her understanding and acceptance.   Dental Advisory Given  Plan Discussed with: CRNA and Surgeon  Anesthesia Plan Comments:        Anesthesia Quick Evaluation
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
## 2013-01-17 ENCOUNTER — Inpatient Hospital Stay (HOSPITAL_COMMUNITY): Payer: Medicaid Other

## 2013-01-17 LAB — GC/CHLAMYDIA PROBE AMP: GC Probe RNA: NEGATIVE

## 2013-01-17 MED ORDER — FENTANYL CITRATE 0.05 MG/ML IJ SOLN
INTRAMUSCULAR | Status: AC
  Administered 2013-01-17: 50 ug via INTRAVENOUS
  Filled 2013-01-17: qty 2

## 2013-01-17 MED ORDER — OXYCODONE-ACETAMINOPHEN 5-325 MG PO TABS
ORAL_TABLET | ORAL | Status: AC
  Administered 2013-01-17: 2 via ORAL
  Filled 2013-01-17: qty 2

## 2013-01-17 MED ORDER — FENTANYL CITRATE 0.05 MG/ML IJ SOLN
25.0000 ug | INTRAMUSCULAR | Status: DC | PRN
  Administered 2013-01-17 (×2): 50 ug via INTRAVENOUS

## 2013-01-17 MED ORDER — ONDANSETRON HCL 4 MG/2ML IJ SOLN
INTRAMUSCULAR | Status: DC | PRN
  Administered 2013-01-17: 4 mg via INTRAVENOUS

## 2013-01-17 MED ORDER — FENTANYL CITRATE 0.05 MG/ML IJ SOLN
INTRAMUSCULAR | Status: AC
  Filled 2013-01-17: qty 2

## 2013-01-17 MED ORDER — LIDOCAINE-EPINEPHRINE 2 %-1:100000 IJ SOLN
INTRAMUSCULAR | Status: DC | PRN
  Administered 2013-01-17: 8 mL

## 2013-01-17 MED ORDER — FENTANYL CITRATE 0.05 MG/ML IJ SOLN
INTRAMUSCULAR | Status: DC | PRN
  Administered 2013-01-16: 50 ug via INTRAVENOUS

## 2013-01-17 MED ORDER — MEPERIDINE HCL 25 MG/ML IJ SOLN: 6.2500 mg | INTRAMUSCULAR | Status: DC | PRN

## 2013-01-17 MED ORDER — DEXAMETHASONE SODIUM PHOSPHATE 10 MG/ML IJ SOLN
INTRAMUSCULAR | Status: DC | PRN
  Administered 2013-01-17: 10 mg via INTRAVENOUS

## 2013-01-17 MED ORDER — OXYCODONE-ACETAMINOPHEN 5-325 MG PO TABS: 1.0000 | ORAL_TABLET | Freq: Once | ORAL | Status: AC

## 2013-01-17 MED ORDER — PROPOFOL INFUSION 10 MG/ML OPTIME
INTRAVENOUS | Status: DC | PRN
  Administered 2013-01-16: 120 ug/kg/min via INTRAVENOUS

## 2013-01-17 MED ORDER — LACTATED RINGERS IV SOLN: INTRAVENOUS | Status: DC

## 2013-01-17 MED ORDER — LIDOCAINE HCL (CARDIAC) 20 MG/ML IV SOLN
INTRAVENOUS | Status: DC | PRN
  Administered 2013-01-16: 60 mg via INTRAVENOUS

## 2013-01-17 MED ORDER — PROMETHAZINE HCL 25 MG/ML IJ SOLN: 6.2500 mg | INTRAMUSCULAR | Status: DC | PRN

## 2013-01-17 MED ORDER — MISOPROSTOL 200 MCG PO TABS: 200.0000 ug | ORAL_TABLET | Freq: Four times a day (QID) | ORAL | Status: DC

## 2013-01-17 MED ORDER — PROPOFOL 10 MG/ML IV EMUL
INTRAVENOUS | Status: DC | PRN
  Administered 2013-01-16: 100 mg via INTRAVENOUS

## 2013-01-17 MED ORDER — MIDAZOLAM HCL 5 MG/5ML IJ SOLN
INTRAMUSCULAR | Status: DC | PRN
  Administered 2013-01-16: 2 mg via INTRAVENOUS

## 2013-01-17 NOTE — Transfer of Care (Signed)
Immediate Anesthesia Transfer of Care Note  Patient: Tammy Robinson  Procedure(s) Performed: Procedure(s): DILATATION AND EVACUATION with untrasound guidance (N/A)  Patient Location: PACU  Anesthesia Type:General  Level of Consciousness: awake, alert  and oriented  Airway & Oxygen Therapy: Patient Spontanous Breathing and Patient connected to nasal cannula oxygen  Post-op Assessment: Report given to PACU RN and Post -op Vital signs reviewed and stable  Post vital signs: Reviewed and stable  Complications: No apparent anesthesia complications

## 2013-01-17 NOTE — Brief Op Note (Signed)
01/16/2013 - 01/17/2013  12:38 AM  PATIENT:  Tammy Robinson  32 y.o. female  PRE-OPERATIVE DIAGNOSIS:  retained products of conception, pain  POST-OPERATIVE DIAGNOSIS:  retained products of conception, pain  PROCEDURE:  Procedure(s): DILATATION AND EVACUATION with untrasound guidance (N/A)  SURGEON:  Surgeon(s) and Role:    * Geryl Rankins, MD - Primary  PHYSICIAN ASSISTANT: None  ASSISTANTS: Technician   ANESTHESIA:   general, failed MAC  EBL:  Total I/O In: 400 [I.V.:400] Out: 400 [Urine:400]  BLOOD ADMINISTERED:none  DRAINS: none   LOCAL MEDICATIONS USED: 2% LIDOCAINE w/ 1/200,000 epinephrine  SPECIMEN:  Source of Specimen:  Retained POCs  DISPOSITION OF SPECIMEN:  PATHOLOGY  COUNTS:  YES  TOURNIQUET:  * No tourniquets in log *  DICTATION: .Other Dictation: Dictation Number [id number]  PLAN OF CARE: Discharge to home after PACU  PATIENT DISPOSITION:  PACU - hemodynamically stable.   Delay start of Pharmacological VTE agent (>24hrs) due to surgical blood loss or risk of bleeding: not applicable
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
## 2013-01-17 NOTE — Anesthesia Postprocedure Evaluation (Signed)
  Anesthesia Post-op Note  Patient: Tammy Robinson  Procedure(s) Performed: Procedure(s) (LRB): DILATATION AND EVACUATION with untrasound guidance (N/A)  Patient Location: PACU  Anesthesia Type: General  Level of Consciousness: awake and alert   Airway and Oxygen Therapy: Patient Spontanous Breathing  Post-op Pain: mild  Post-op Assessment: Post-op Vital signs reviewed, Patient's Cardiovascular Status Stable, Respiratory Function Stable, Patent Airway and No signs of Nausea or vomiting  Last Vitals:  Filed Vitals:   01/17/13 0036  BP:   Pulse: 104  Temp: 36.8 C  Resp: 16    Post-op Vital Signs: stable   Complications: No apparent anesthesia complications. Pt awake. Talking. Doing well.

## 2013-01-17 NOTE — Anesthesia Procedure Notes (Signed)
Procedure Name: LMA Insertion Date/Time: 01/16/2013 11:58 PM Performed by: Kenden Brandt, Jannet Askew Pre-anesthesia Checklist: Patient identified, Timeout performed, Emergency Drugs available, Suction available and Patient being monitored Patient Re-evaluated:Patient Re-evaluated prior to inductionOxygen Delivery Method: Circle system utilized Preoxygenation: Pre-oxygenation with 100% oxygen Intubation Type: IV induction LMA: LMA inserted LMA Size: 4.0 Number of attempts: 1 Placement Confirmation: positive ETCO2 and breath sounds checked- equal and bilateral ETT to lip (cm): at ;ips.

## 2013-01-17 NOTE — Op Note (Signed)
NAMEBRODY, KUMP NO.:  [id number]  MEDICAL RECORD NO.:  [id number]  LOCATION:  [facility]                          FACILITY:  [facility]  PHYSICIAN:  Pieter Partridge, MD   DATE OF BIRTH:  1980-12-20  DATE OF PROCEDURE:  01/17/2013 DATE OF DISCHARGE:                              OPERATIVE REPORT   PREOPERATIVE DIAGNOSIS:  Retained products of conception and abdominal pain.  POSTOPERATIVE DIAGNOSIS:  Retained products of conception and abdominal pain.  PROCEDURE:  Dilation and evacuation with ultrasound guidance.  SURGEON:  Pieter Partridge, MD.  ASSISTANT:  Technician.  ANESTHESIA:  General failed, MAC.  EBL:  Approximately 50.  BLOOD ADMINISTERED:  None.  DRAINS:  None.  SPECIMEN:  Local is 2% lidocaine with 1:200,000 epinephrine.  SPECIMENS:  Retained products of conception.  DISPOSITION:  To pathology.  PLAN OF CARE:  Discharge home after PACU.  DISPOSITION:  TIPS to PACU, hemodynamically stable.  COMPLICATIONS:  None.  FINDINGS:  A large volume of dark blood evacuated initially from the uterus, also yellow tissue consistent with potential retained products versus decidua noted after an adequate curettage there still remained some echogenic tissue in the endometrial cavity, which was retrieved. The cervix was not dilated.  Uterus was anteverted and uterus sounded to approximately 10 cm.  INDICATIONS:  Tammy Robinson is a 32 year old, gravida 5, para 3-0-2- 3, who underwent dilation and evacuation of a 15-week fetus due to IFD on December 30, 2012.  The procedure was done under ultrasound guidance and it was felt to be adequate.  The patient presented to the office approximately a week or so ago, still with complaint of bleeding.  There was some thickening of the endometrial lining, but no retained products noted.  She was given Cytotec and had quite a bit of bleeding.  She presented to the emergency room tonight with complaint of pain  and continued bleeding, but no symptoms of anemia, hemoglobin was 12.9, previously was 13.1.  Ultrasound showed a approximately 3 cm distention of complex fluid in the endometrial cavity and a 3.7 cm echogenic hypervascular area consistent with retained products.  The patient was counseled on retained products and dilation and evacuation was recommended and consent was obtained.  PROCEDURE IN DETAIL:  Tammy Robinson was taken to the operating room, where she was placed in the dorsal supine then lithotomy position and she underwent MAC anesthesia.  The patient was conversating after appropriate anesthesia had been administered.  I began the prep when the patient was more somnolent, which she failed and the CRNA gave her more medications.  Then proceeded to complete the abdominal prep when I did the vaginal portion of the prepped, the patient was uncomfortable and she pulled her right leg out of the stair up.  At that point, it was decided that she would need more general anesthesia and an LMA was performed without complication.  The prep was then completed without an issue.  Graves speculum was inserted into the vagina.  There was some brown discharge noted at the cervix and that was cleaned off with saline and sponge.  Sponge was passed off the field, approximately 2-3 mL of local  anesthetic described 2% lidocaine with epinephrine was injected at the anterior lip of the cervix.  A single-tooth tenaculum was then applied and a paracervical block was performed for a total of approximately 8 mL.  The cervix was then dilated up to a 10 Hegar.  There was no immediate gush of fluid noted and there was no active bleeding at the time of the dilatation.  A 9 mm suction curette was then advanced to the uterine fundus and suction was activated and there was a copious amount of dark blood that was returned with some tissue within that.  The curettage was performed approximately 3 passes until there  was no return of tissue or blood. Sharp curettage was done of all 4 quadrants returning really not a lot of products and it felt really gritty on the anterior portion where the remaining portion of the placenta was to have the an ultrasound or appeared to be on ultrasound.  I did complete another suction curette and suction curettage did not have any return of bleeding and at that time, I wanted to look with the ultrasound to confirm that had gotten all products.  Transabdominal ultrasound was then performed with the ultrasonographer at the bed side.  There was still quite a bit of echogenic tissue down there.  The endometrial stripe measured 1 cm, so I continued to do another sharp curettage under ultrasound guidance to make sure was giving the tissue and I was not very successful.  I did do a suction curettage and that did cut down on the volume of the echogenic tissue. Finally, I was concerned about possible uterine perforation and I was not able to adequately get the tissue, however, follow with the suction curette, I was right on the area of the tissue that we saw on ultrasound and it was adequately suctioned out, revealing an empty uterus and a normal endometrium.  Once the suction curette was removed, there was no bleeding noted.  The single-tooth tenaculum was removed from the anterior lip of the cervix and the tenaculum sites were hemostatic.  The Graves speculum was then removed.  The patient tolerated the procedure well.  Due to penicillin allergy, she get gentamicin and clindamycin prior to the procedure.  She had SCDs on and operating throughout the case.  She did tolerate the procedure well once she was adequately asleep or once general anesthesia was initiated.  Time-out was taken prior to the procedure.     Pieter Partridge, MD     EBV/MEDQ  D:  01/17/2013  T:  01/17/2013  Job:  [id number]

## 2013-01-17 NOTE — Transfer of Care (Signed)
Immediate Anesthesia Transfer of Care Note  Patient: Tammy Robinson  Procedure(s) Performed: Procedure(s): DILATATION AND EVACUATION with untrasound guidance (N/A)  Patient Location: PACU  Anesthesia Type:General  Level of Consciousness: awake, alert  and oriented  Airway & Oxygen Therapy: Patient Spontanous Breathing and Patient connected to nasal cannula oxygen  Post-op Assessment: Report given to PACU RN and Post -op Vital signs reviewed and stable  Post vital signs: Reviewed  Complications: No apparent anesthesia complications

## 2013-01-18 ENCOUNTER — Encounter (HOSPITAL_COMMUNITY): Payer: Self-pay | Admitting: Obstetrics and Gynecology

## 2013-01-18 LAB — TYPE AND SCREEN
ABO/RH(D): O POS
DAT, IgG: NEGATIVE
Donor AG Type: NEGATIVE
Unit division: 0
Unit division: 0

## 2013-01-24 ENCOUNTER — Emergency Department (HOSPITAL_COMMUNITY)
Admission: EM | Admit: 2013-01-24 | Discharge: 2013-01-25 | Disposition: A | Discharge status: Home / Self Care | Payer: Medicaid Other | Attending: Emergency Medicine | Admitting: Emergency Medicine

## 2013-01-24 ENCOUNTER — Encounter (HOSPITAL_COMMUNITY): Payer: Self-pay | Admitting: Emergency Medicine

## 2013-01-24 DIAGNOSIS — F17200 Nicotine dependence, unspecified, uncomplicated: Secondary | ICD-10-CM | POA: Insufficient documentation

## 2013-01-24 DIAGNOSIS — Z3202 Encounter for pregnancy test, result negative: Secondary | ICD-10-CM | POA: Insufficient documentation

## 2013-01-24 DIAGNOSIS — Z880 Allergy status to penicillin: Secondary | ICD-10-CM | POA: Insufficient documentation

## 2013-01-24 DIAGNOSIS — I10 Essential (primary) hypertension: Secondary | ICD-10-CM | POA: Insufficient documentation

## 2013-01-24 DIAGNOSIS — R0602 Shortness of breath: Secondary | ICD-10-CM | POA: Insufficient documentation

## 2013-01-24 DIAGNOSIS — F411 Generalized anxiety disorder: Secondary | ICD-10-CM | POA: Insufficient documentation

## 2013-01-24 DIAGNOSIS — R0789 Other chest pain: Secondary | ICD-10-CM

## 2013-01-24 DIAGNOSIS — Z87440 Personal history of urinary (tract) infections: Secondary | ICD-10-CM | POA: Insufficient documentation

## 2013-01-24 DIAGNOSIS — Z8742 Personal history of other diseases of the female genital tract: Secondary | ICD-10-CM | POA: Insufficient documentation

## 2013-01-24 DIAGNOSIS — Z79899 Other long term (current) drug therapy: Secondary | ICD-10-CM | POA: Insufficient documentation

## 2013-01-24 DIAGNOSIS — R071 Chest pain on breathing: Secondary | ICD-10-CM | POA: Insufficient documentation

## 2013-01-24 LAB — CBC
HCT: 38.8 % (ref 36.0–46.0)
Platelets: 323 10*3/uL (ref 150–400)
RBC: 4.04 MIL/uL (ref 3.87–5.11)
RDW: 13.8 % (ref 11.5–15.5)
WBC: 10.3 10*3/uL (ref 4.0–10.5)

## 2013-01-24 LAB — POCT I-STAT TROPONIN I: Troponin i, poc: 0.01 ng/mL (ref 0.00–0.08)

## 2013-01-24 LAB — BASIC METABOLIC PANEL
BUN: 9 mg/dL (ref 6–23)
CO2: 28 mEq/L (ref 19–32)
Chloride: 101 mEq/L (ref 96–112)
GFR calc Af Amer: >90 mL/min (ref >90)
Potassium: 3.4 mEq/L — ABNORMAL LOW (ref 3.5–5.1)

## 2013-01-24 NOTE — ED Notes (Signed)
Pt presents to Ed with a complaint of chest pain.  Pt states it began yesterday and has progressed in pain.  Pt states it is causing her left arm to become numb.  Pt has a hx of panic attacks but states this is not one of those.

## 2013-01-25 ENCOUNTER — Encounter (HOSPITAL_COMMUNITY): Payer: Self-pay | Admitting: Radiology

## 2013-01-25 ENCOUNTER — Emergency Department (HOSPITAL_COMMUNITY): Payer: Medicaid Other

## 2013-01-25 LAB — POCT I-STAT TROPONIN I: Troponin i, poc: 0.01 ng/mL (ref 0.00–0.08)

## 2013-01-25 LAB — D-DIMER, QUANTITATIVE: D-Dimer, Quant: 1.2 ug/mL-FEU — ABNORMAL HIGH (ref 0.00–0.48)

## 2013-01-25 MED ORDER — ASPIRIN 81 MG PO CHEW
324.0000 mg | CHEWABLE_TABLET | Freq: Once | ORAL | Status: AC
  Administered 2013-01-25: 324 mg via ORAL
  Filled 2013-01-25: qty 4

## 2013-01-25 MED ORDER — IOHEXOL 350 MG/ML SOLN
100.0000 mL | Freq: Once | INTRAVENOUS | Status: AC | PRN
  Administered 2013-01-25: 100 mL via INTRAVENOUS

## 2013-01-25 MED ORDER — MORPHINE SULFATE 4 MG/ML IJ SOLN
4.0000 mg | Freq: Once | INTRAMUSCULAR | Status: AC
  Administered 2013-01-25: 4 mg via INTRAVENOUS
  Filled 2013-01-25 (×2): qty 1

## 2013-01-25 MED ORDER — NAPROXEN 500 MG PO TABS: 500.0000 mg | ORAL_TABLET | Freq: Two times a day (BID) | ORAL | Status: DC

## 2013-01-25 MED ORDER — KETOROLAC TROMETHAMINE 30 MG/ML IJ SOLN
30.0000 mg | Freq: Once | INTRAMUSCULAR | Status: AC
  Administered 2013-01-25: 30 mg via INTRAMUSCULAR
  Filled 2013-01-25: qty 1

## 2013-01-25 MED FILL — Gentamicin Sulfate Inj 40 MG/ML: INTRAMUSCULAR | Qty: 8.74 | Status: AC
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
## 2013-01-30 NOTE — ED Provider Notes (Signed)
Medical screening examination/treatment/procedure(s) were performed by non-physician practitioner and as supervising physician I was immediately available for consultation/collaboration.  Darianna Amy F Lisset Ketchem, MD 01/30/13 1543 

## 2013-09-20 ENCOUNTER — Inpatient Hospital Stay (HOSPITAL_COMMUNITY)
Admission: AD | Admit: 2013-09-20 | Discharge: 2013-09-20 | Discharge status: Against Medical Advice | Payer: Medicaid Other | Source: Ambulatory Visit | Attending: Obstetrics & Gynecology | Admitting: Obstetrics & Gynecology

## 2013-09-20 ENCOUNTER — Encounter (HOSPITAL_COMMUNITY): Payer: Self-pay | Admitting: *Deleted

## 2013-09-20 ENCOUNTER — Inpatient Hospital Stay (HOSPITAL_COMMUNITY): Payer: Medicaid Other

## 2013-09-20 DIAGNOSIS — O09529 Supervision of elderly multigravida, unspecified trimester: Secondary | ICD-10-CM

## 2013-09-20 DIAGNOSIS — O9989 Other specified diseases and conditions complicating pregnancy, childbirth and the puerperium: Principal | ICD-10-CM

## 2013-09-20 DIAGNOSIS — O10019 Pre-existing essential hypertension complicating pregnancy, unspecified trimester: Secondary | ICD-10-CM | POA: Insufficient documentation

## 2013-09-20 DIAGNOSIS — O99330 Smoking (tobacco) complicating pregnancy, unspecified trimester: Secondary | ICD-10-CM | POA: Insufficient documentation

## 2013-09-20 DIAGNOSIS — R1031 Right lower quadrant pain: Secondary | ICD-10-CM | POA: Insufficient documentation

## 2013-09-20 DIAGNOSIS — R1084 Generalized abdominal pain: Secondary | ICD-10-CM

## 2013-09-20 DIAGNOSIS — O99891 Other specified diseases and conditions complicating pregnancy: Secondary | ICD-10-CM | POA: Insufficient documentation

## 2013-09-20 LAB — CBC
HCT: 45.4 % (ref 36.0–46.0)
Hemoglobin: 15.8 g/dL — ABNORMAL HIGH (ref 12.0–15.0)
MCH: 33.1 pg (ref 26.0–34.0)
MCHC: 34.8 g/dL (ref 30.0–36.0)
MCV: 95.2 fL (ref 78.0–100.0)
PLATELETS: 261 10*3/uL (ref 150–400)
RBC: 4.77 MIL/uL (ref 3.87–5.11)
RDW: 13.4 % (ref 11.5–15.5)
WBC: 8.8 10*3/uL (ref 4.0–10.5)

## 2013-09-20 LAB — URINE MICROSCOPIC-ADD ON

## 2013-09-20 LAB — POCT PREGNANCY, URINE: Preg Test, Ur: POSITIVE — AB

## 2013-09-20 LAB — URINALYSIS, ROUTINE W REFLEX MICROSCOPIC
Bilirubin Urine: NEGATIVE
Glucose, UA: NEGATIVE mg/dL
Hgb urine dipstick: NEGATIVE
Ketones, ur: NEGATIVE mg/dL
NITRITE: NEGATIVE
PH: 6 (ref 5.0–8.0)
Protein, ur: NEGATIVE mg/dL
SPECIFIC GRAVITY, URINE: 1.01 (ref 1.005–1.030)
Urobilinogen, UA: 0.2 mg/dL (ref 0.0–1.0)

## 2013-09-20 LAB — HCG, QUANTITATIVE, PREGNANCY: hCG, Beta Chain, Quant, S: 1054 m[IU]/mL — ABNORMAL HIGH (ref <5)

## 2013-09-20 NOTE — MAU Note (Signed)
Severe pain in RLQ, pain is constant.  Just found out preg this weekend

## 2013-09-20 NOTE — MAU Provider Note (Signed)
Went to eval pt in MAU and pt had reportedly 'stromed out'.  No reason given.  Pt was not formally evaluated by this physician Eber Jonesarolyn L. Harraway-Smith, M.D., Evern CoreFACOG
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
## 2013-11-04 ENCOUNTER — Inpatient Hospital Stay (HOSPITAL_COMMUNITY)
Admission: AD | Admit: 2013-11-04 | Discharge: 2013-11-04 | Disposition: A | Discharge status: Home / Self Care | Payer: Medicaid Other | Source: Ambulatory Visit | Attending: Obstetrics & Gynecology | Admitting: Obstetrics & Gynecology

## 2013-11-04 ENCOUNTER — Inpatient Hospital Stay (HOSPITAL_COMMUNITY): Payer: Medicaid Other

## 2013-11-04 ENCOUNTER — Encounter (HOSPITAL_COMMUNITY): Payer: Self-pay | Admitting: *Deleted

## 2013-11-04 DIAGNOSIS — O034 Incomplete spontaneous abortion without complication: Secondary | ICD-10-CM

## 2013-11-04 DIAGNOSIS — O071 Delayed or excessive hemorrhage following failed attempted termination of pregnancy: Secondary | ICD-10-CM | POA: Insufficient documentation

## 2013-11-04 DIAGNOSIS — N83209 Unspecified ovarian cyst, unspecified side: Secondary | ICD-10-CM | POA: Insufficient documentation

## 2013-11-04 DIAGNOSIS — I10 Essential (primary) hypertension: Secondary | ICD-10-CM | POA: Insufficient documentation

## 2013-11-04 DIAGNOSIS — F17200 Nicotine dependence, unspecified, uncomplicated: Secondary | ICD-10-CM | POA: Insufficient documentation

## 2013-11-04 DIAGNOSIS — N949 Unspecified condition associated with female genital organs and menstrual cycle: Secondary | ICD-10-CM | POA: Diagnosis present

## 2013-11-04 DIAGNOSIS — N938 Other specified abnormal uterine and vaginal bleeding: Secondary | ICD-10-CM | POA: Diagnosis present

## 2013-11-04 LAB — HCG, QUANTITATIVE, PREGNANCY: HCG, BETA CHAIN, QUANT, S: 758 m[IU]/mL — AB (ref <5)

## 2013-11-04 LAB — CBC
HCT: 39.4 % (ref 36.0–46.0)
HEMOGLOBIN: 13 g/dL (ref 12.0–15.0)
MCH: 31.8 pg (ref 26.0–34.0)
MCHC: 33 g/dL (ref 30.0–36.0)
MCV: 96.3 fL (ref 78.0–100.0)
PLATELETS: 318 10*3/uL (ref 150–400)
RBC: 4.09 MIL/uL (ref 3.87–5.11)
RDW: 14.1 % (ref 11.5–15.5)
WBC: 14.5 10*3/uL — ABNORMAL HIGH (ref 4.0–10.5)

## 2013-11-04 MED ORDER — OXYCODONE-ACETAMINOPHEN 5-325 MG PO TABS
2.0000 | ORAL_TABLET | Freq: Once | ORAL | Status: AC
  Administered 2013-11-04: 2 via ORAL
  Filled 2013-11-04: qty 2

## 2013-11-04 MED ORDER — MISOPROSTOL 200 MCG PO TABS: 800.0000 ug | ORAL_TABLET | Freq: Once | ORAL | Status: DC

## 2013-11-04 MED ORDER — MISOPROSTOL 200 MCG PO TABS
800.0000 ug | ORAL_TABLET | Freq: Once | ORAL | Status: AC
  Administered 2013-11-04: 800 ug via RECTAL
  Filled 2013-11-04: qty 4

## 2013-11-04 NOTE — Discharge Instructions (Signed)
. °  HOME CARE INSTRUCTIONS   Your caregiver may order bed rest or may allow you to continue light activity. Resume activity as directed by your caregiver.  Have someone help with home and family responsibilities during this time.   Keep track of the number of sanitary pads you use each day and how soaked (saturated) they are. Write down this information.   Do not use tampons. Do not douche or have sexual intercourse until approved by your caregiver.   Only take over-the-counter or prescription medicines for pain or discomfort as directed by your caregiver.   Do not take aspirin. Aspirin can cause bleeding.   Keep all follow-up appointments with your caregiver.   If you or your partner have problems with grieving, talk to your caregiver or seek counseling to help cope with the pregnancy loss. Allow enough time to grieve before trying to get pregnant again.  Call your clinic if:   You have severe cramps or pain in your back or abdomen.  You have a fever.  You pass large blood clots (walnut-sized or larger) ortissue from your vagina. Save any tissue for your caregiver to inspect.   Your bleeding increases.   You have a thick, bad-smelling vaginal discharge.  You become lightheaded, weak, or you faint.   You have chills.  MAKE SURE YOU:  Understand these instructions.  Will watch your condition.  Will get help right away if you are not doing well or get worse. Document Released: 11/05/2000 Document Revised: 09/06/2012 Document Reviewed: 07/01/2011 [facility] Patient Information 2014 [employer], [place].

## 2013-11-04 NOTE — MAU Note (Signed)
Pt reports pain and bleeding since yesterday. Pt had a TAB about 2 weeks ago. Pt states that the bleeding has been heavy for 8 hours.
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
## 2013-11-06 ENCOUNTER — Ambulatory Visit (HOSPITAL_COMMUNITY)
Admission: AD | Admit: 2013-11-06 | Discharge: 2013-11-06 | Disposition: A | Discharge status: Home / Self Care | Payer: Medicaid Other | Source: Ambulatory Visit | Attending: Family Medicine | Admitting: Family Medicine

## 2013-11-06 ENCOUNTER — Encounter (HOSPITAL_COMMUNITY)
Admission: AD | Disposition: A | Discharge status: Home / Self Care | Payer: Self-pay | Source: Ambulatory Visit | Attending: Family Medicine

## 2013-11-06 ENCOUNTER — Inpatient Hospital Stay (HOSPITAL_COMMUNITY): Payer: Medicaid Other | Admitting: Anesthesiology

## 2013-11-06 ENCOUNTER — Encounter (HOSPITAL_COMMUNITY): Payer: Medicaid Other | Admitting: Anesthesiology

## 2013-11-06 ENCOUNTER — Encounter (HOSPITAL_COMMUNITY): Payer: Self-pay | Admitting: *Deleted

## 2013-11-06 DIAGNOSIS — F411 Generalized anxiety disorder: Secondary | ICD-10-CM | POA: Insufficient documentation

## 2013-11-06 DIAGNOSIS — D62 Acute posthemorrhagic anemia: Secondary | ICD-10-CM | POA: Diagnosis not present

## 2013-11-06 DIAGNOSIS — O0289 Other abnormal products of conception: Secondary | ICD-10-CM

## 2013-11-06 DIAGNOSIS — O071 Delayed or excessive hemorrhage following failed attempted termination of pregnancy: Secondary | ICD-10-CM

## 2013-11-06 DIAGNOSIS — F17200 Nicotine dependence, unspecified, uncomplicated: Secondary | ICD-10-CM | POA: Diagnosis not present

## 2013-11-06 DIAGNOSIS — O034 Incomplete spontaneous abortion without complication: Secondary | ICD-10-CM | POA: Diagnosis present

## 2013-11-06 DIAGNOSIS — I10 Essential (primary) hypertension: Secondary | ICD-10-CM | POA: Insufficient documentation

## 2013-11-06 DIAGNOSIS — O070 Genital tract and pelvic infection following failed attempted termination of pregnancy: Secondary | ICD-10-CM

## 2013-11-06 HISTORY — PX: DILATION AND EVACUATION: SHX1459

## 2013-11-06 LAB — CBC WITH DIFFERENTIAL/PLATELET
BASOS ABS: 0.1 10*3/uL (ref 0.0–0.1)
BASOS PCT: 0 % (ref 0–1)
Eosinophils Absolute: 0.1 10*3/uL (ref 0.0–0.7)
Eosinophils Relative: 1 % (ref 0–5)
HEMATOCRIT: 25.5 % — AB (ref 36.0–46.0)
Hemoglobin: 8.6 g/dL — ABNORMAL LOW (ref 12.0–15.0)
Lymphocytes Relative: 24 % (ref 12–46)
Lymphs Abs: 3.2 10*3/uL (ref 0.7–4.0)
MCH: 32.1 pg (ref 26.0–34.0)
MCHC: 33.7 g/dL (ref 30.0–36.0)
MCV: 95.1 fL (ref 78.0–100.0)
MONO ABS: 1 10*3/uL (ref 0.1–1.0)
Monocytes Relative: 7 % (ref 3–12)
NEUTROS ABS: 9.3 10*3/uL — AB (ref 1.7–7.7)
Neutrophils Relative %: 68 % (ref 43–77)
Platelets: 296 10*3/uL (ref 150–400)
RBC: 2.68 MIL/uL — ABNORMAL LOW (ref 3.87–5.11)
RDW: 14.1 % (ref 11.5–15.5)
WBC: 13.7 10*3/uL — ABNORMAL HIGH (ref 4.0–10.5)

## 2013-11-06 SURGERY — DILATION AND EVACUATION, UTERUS
Anesthesia: General | Site: Uterus

## 2013-11-06 MED ORDER — FENTANYL CITRATE 0.05 MG/ML IJ SOLN
INTRAMUSCULAR | Status: AC
  Administered 2013-11-06: 50 ug via INTRAVENOUS
  Filled 2013-11-06: qty 2

## 2013-11-06 MED ORDER — HYDROMORPHONE HCL PF 1 MG/ML IJ SOLN
INTRAMUSCULAR | Status: AC
  Filled 2013-11-06: qty 1

## 2013-11-06 MED ORDER — CITRIC ACID-SODIUM CITRATE 334-500 MG/5ML PO SOLN
30.0000 mL | Freq: Once | ORAL | Status: AC
Start: 2013-11-06 — End: 2013-11-06
  Administered 2013-11-06: 30 mL via ORAL

## 2013-11-06 MED ORDER — PROPOFOL 10 MG/ML IV BOLUS
INTRAVENOUS | Status: DC | PRN
  Administered 2013-11-06: 150 mg via INTRAVENOUS

## 2013-11-06 MED ORDER — ONDANSETRON HCL 4 MG/2ML IJ SOLN
INTRAMUSCULAR | Status: AC
  Filled 2013-11-06: qty 2

## 2013-11-06 MED ORDER — LACTATED RINGERS IV BOLUS (SEPSIS)
1000.0000 mL | Freq: Once | INTRAVENOUS | Status: AC
  Administered 2013-11-06: 1000 mL via INTRAVENOUS

## 2013-11-06 MED ORDER — KETOROLAC TROMETHAMINE 30 MG/ML IJ SOLN
INTRAMUSCULAR | Status: AC
  Filled 2013-11-06: qty 1

## 2013-11-06 MED ORDER — PROPOFOL 10 MG/ML IV EMUL
INTRAVENOUS | Status: AC
  Filled 2013-11-06: qty 20

## 2013-11-06 MED ORDER — MEPERIDINE HCL 25 MG/ML IJ SOLN: 6.2500 mg | INTRAMUSCULAR | Status: DC | PRN

## 2013-11-06 MED ORDER — MIDAZOLAM HCL 2 MG/2ML IJ SOLN
INTRAMUSCULAR | Status: DC | PRN
  Administered 2013-11-06: 2 mg via INTRAVENOUS

## 2013-11-06 MED ORDER — FENTANYL CITRATE 0.05 MG/ML IJ SOLN
25.0000 ug | INTRAMUSCULAR | Status: DC | PRN
  Administered 2013-11-06 (×2): 50 ug via INTRAVENOUS

## 2013-11-06 MED ORDER — FENTANYL CITRATE 0.05 MG/ML IJ SOLN
INTRAMUSCULAR | Status: DC | PRN
  Administered 2013-11-06 (×2): 25 ug via INTRAVENOUS
  Administered 2013-11-06: 50 ug via INTRAVENOUS

## 2013-11-06 MED ORDER — PROMETHAZINE HCL 25 MG/ML IJ SOLN: 6.2500 mg | INTRAMUSCULAR | Status: DC | PRN

## 2013-11-06 MED ORDER — 0.9 % SODIUM CHLORIDE (POUR BTL) OPTIME
TOPICAL | Status: DC | PRN
  Administered 2013-11-06: 1000 mL

## 2013-11-06 MED ORDER — ONDANSETRON HCL 4 MG/2ML IJ SOLN
INTRAMUSCULAR | Status: DC | PRN
  Administered 2013-11-06: 4 mg via INTRAVENOUS

## 2013-11-06 MED ORDER — FAMOTIDINE IN NACL 20-0.9 MG/50ML-% IV SOLN
20.0000 mg | Freq: Once | INTRAVENOUS | Status: AC
  Administered 2013-11-06: 20 mg via INTRAVENOUS
  Filled 2013-11-06: qty 50

## 2013-11-06 MED ORDER — MIDAZOLAM HCL 2 MG/2ML IJ SOLN
INTRAMUSCULAR | Status: AC
  Filled 2013-11-06: qty 2

## 2013-11-06 MED ORDER — CITRIC ACID-SODIUM CITRATE 334-500 MG/5ML PO SOLN
ORAL | Status: AC
  Filled 2013-11-06: qty 15

## 2013-11-06 MED ORDER — LIDOCAINE HCL (CARDIAC) 20 MG/ML IV SOLN
INTRAVENOUS | Status: DC | PRN
  Administered 2013-11-06: 30 mg via INTRAVENOUS

## 2013-11-06 MED ORDER — IBUPROFEN 600 MG PO TABS: 600.0000 mg | ORAL_TABLET | Freq: Four times a day (QID) | ORAL | Status: DC | PRN

## 2013-11-06 MED ORDER — HYDROMORPHONE HCL PF 1 MG/ML IJ SOLN
0.2500 mg | INTRAMUSCULAR | Status: DC | PRN
  Administered 2013-11-06: 0.5 mg via INTRAVENOUS

## 2013-11-06 MED ORDER — KETOROLAC TROMETHAMINE 30 MG/ML IJ SOLN
INTRAMUSCULAR | Status: DC | PRN
  Administered 2013-11-06: 30 mg via INTRAVENOUS

## 2013-11-06 MED ORDER — FENTANYL CITRATE 0.05 MG/ML IJ SOLN
INTRAMUSCULAR | Status: AC
  Filled 2013-11-06: qty 2

## 2013-11-06 MED ORDER — LIDOCAINE-EPINEPHRINE 1 %-1:100000 IJ SOLN
INTRAMUSCULAR | Status: DC | PRN
Start: 2013-11-06 — End: 2013-11-06
  Administered 2013-11-06: 20 mL

## 2013-11-06 MED ORDER — LACTATED RINGERS IV SOLN
INTRAVENOUS | Status: DC | PRN
  Administered 2013-11-06: 14:00:00 via INTRAVENOUS

## 2013-11-06 MED ORDER — LACTATED RINGERS IV SOLN
INTRAVENOUS | Status: DC
  Administered 2013-11-06: 125 mL/h via INTRAVENOUS

## 2013-11-06 MED ORDER — LIDOCAINE HCL (CARDIAC) 20 MG/ML IV SOLN
INTRAVENOUS | Status: AC
  Filled 2013-11-06: qty 5

## 2013-11-06 MED ORDER — DOXYCYCLINE HYCLATE 100 MG IV SOLR
100.0000 mg | Freq: Once | INTRAVENOUS | Status: DC
  Filled 2013-11-06: qty 100

## 2013-11-06 MED ORDER — KETOROLAC TROMETHAMINE 60 MG/2ML IM SOLN
60.0000 mg | Freq: Once | INTRAMUSCULAR | Status: AC
  Administered 2013-11-06: 60 mg via INTRAMUSCULAR
  Filled 2013-11-06: qty 2

## 2013-11-06 SURGICAL SUPPLY — 16 items
CATH ROBINSON RED A/P 16FR (CATHETERS) ×4 IMPLANT
CLOTH BEACON ORANGE TIMEOUT ST (SAFETY) ×4 IMPLANT
CONTAINER PREFILL 10% NBF 60ML (FORM) ×8 IMPLANT
DECANTER SPIKE VIAL GLASS SM (MISCELLANEOUS) ×4 IMPLANT
DRSG TELFA 3X8 NADH (GAUZE/BANDAGES/DRESSINGS) ×4 IMPLANT
GLOVE BIOGEL PI IND STRL 7.0 (GLOVE) ×2 IMPLANT
GLOVE BIOGEL PI INDICATOR 7.0 (GLOVE) ×2
GLOVE ECLIPSE 7.0 STRL STRAW (GLOVE) ×8 IMPLANT
GOWN STRL REUS W/TWL LRG LVL3 (GOWN DISPOSABLE) ×12 IMPLANT
PACK VAGINAL MINOR WOMEN LF (CUSTOM PROCEDURE TRAY) ×4 IMPLANT
PAD OB MATERNITY 4.3X12.25 (PERSONAL CARE ITEMS) ×4 IMPLANT
PAD PREP 24X48 CUFFED NSTRL (MISCELLANEOUS) ×4 IMPLANT
SET BERKELEY SUCTION TUBING (SUCTIONS) ×4 IMPLANT
TOWEL OR 17X24 6PK STRL BLUE (TOWEL DISPOSABLE) ×8 IMPLANT
VACURETTE 10 RIGID CVD (CANNULA) ×4 IMPLANT
WATER STERILE IRR 1000ML POUR (IV SOLUTION) ×4 IMPLANT
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
## 2013-11-06 NOTE — Anesthesia Postprocedure Evaluation (Signed)
  Anesthesia Post-op Note  Patient: Tammy Robinson  Procedure(s) Performed: Procedure(s) (LRB): DILATATION AND EVACUATION (N/A)  Patient Location: PACU  Anesthesia Type: General  Level of Consciousness: awake and alert   Airway and Oxygen Therapy: Patient Spontanous Breathing  Post-op Pain: mild  Post-op Assessment: Post-op Vital signs reviewed, Patient's Cardiovascular Status Stable, Respiratory Function Stable, Patent Airway and No signs of Nausea or vomiting  Last Vitals:  Filed Vitals:   11/06/13 1450  BP: 115/70  Pulse: 104  Temp: 36.6 C  Resp: 12    Post-op Vital Signs: stable   Complications: No apparent anesthesia complications

## 2013-11-06 NOTE — MAU Note (Signed)
33 yo, G6P3, s/p TAB on 5/28 at approximately 8 weeks. Continues to bleed and cramp. Large clots passed last night.  Do not mention in front of partner.

## 2013-11-06 NOTE — MAU Provider Note (Signed)
Attestation of Attending Supervision of Advanced Practitioner (PA/CNM/NP): Evaluation and management procedures were performed by the Advanced Practitioner under my supervision and collaboration.  I have reviewed the Advanced Practitioner's note and chart, and I agree with the management and plan.  Reva BoresPRATT,TANYA S, MD [facility] Attending 11/06/2013 1:35 PM

## 2013-11-06 NOTE — Anesthesia Preprocedure Evaluation (Signed)
Anesthesia Evaluation  Patient identified by MRN, date of birth, ID band Patient awake    Reviewed: Allergy & Precautions, H&P , NPO status , Patient's Chart, lab work & pertinent test results, reviewed documented beta blocker date and time   Airway Mallampati: II TM Distance: >3 FB Neck ROM: full    Dental no notable dental hx.    Pulmonary Current Smoker,  breath sounds clear to auscultation  Pulmonary exam normal       Cardiovascular hypertension, Pt. on medications Rhythm:regular Rate:Normal     Neuro/Psych PSYCHIATRIC DISORDERS Anxiety negative neurological ROS     GI/Hepatic negative GI ROS, Neg liver ROS,   Endo/Other  negative endocrine ROS  Renal/GU negative Renal ROS     Musculoskeletal negative musculoskeletal ROS (+)   Abdominal   Peds  Hematology  (+) anemia , Kell antibody   Anesthesia Other Findings   Reproductive/Obstetrics (+) Pregnancy                           Anesthesia Physical  Anesthesia Plan  ASA: II and emergent  Anesthesia Plan: General   Post-op Pain Management:    Induction: Intravenous  Airway Management Planned: LMA  Additional Equipment:   Intra-op Plan:   Post-operative Plan:   Informed Consent: I have reviewed the patients History and Physical, chart, labs and discussed the procedure including the risks, benefits and alternatives for the proposed anesthesia with the patient or authorized representative who has indicated his/her understanding and acceptance.   Dental Advisory Given  Plan Discussed with: CRNA and Surgeon  Anesthesia Plan Comments:         Anesthesia Quick Evaluation

## 2013-11-06 NOTE — Discharge Instructions (Signed)
Dilation and Curettage or Vacuum Curettage, Care After  Refer to this sheet in the next few weeks. These instructions provide you with information on caring for yourself after your procedure. Your health care provider may also give you more specific instructions. Your treatment has been planned according to current medical practices, but problems sometimes occur. Call your health care provider if you have any problems or questions after your procedure.  WHAT TO EXPECT AFTER THE PROCEDURE  After your procedure, it is typical to have light cramping and bleeding. This may last for 2 days to 2 weeks after the procedure.  HOME CARE INSTRUCTIONS   · Do not drive for 24 hours.  · Wait 1 week before returning to strenuous activities.  · Take your temperature 2 times a day for 4 days and write it down. Provide these temperatures to your health care provider if you develop a fever.  · Avoid long periods of standing.  · Avoid heavy lifting, pushing, or pulling. Do not lift anything heavier than 10 pounds (4.5 kg).  · Limit stair climbing to once or twice a day.  · Take rest periods often.  · You may resume your usual diet.  · Drink enough fluids to keep your urine clear or pale yellow.  · Your usual bowel function should return. If you have constipation, you may:  · Take a mild laxative with permission from your health care provider.  · Add fruit and bran to your diet.  · Drink more fluids.  · Take showers instead of baths until your health care provider gives you permission to take baths.  · Do not go swimming or use a hot tub until your health care provider approves.  · Try to have someone with you or available to you the first 24 48 hours, especially if you were given a general anesthetic.  · Do not douche, use tampons, or have intercourse for 2 weeks after the procedure.  · Only take over-the-counter or prescription medicines as directed by your health care provider. Do not take aspirin. It can cause bleeding.  · Follow up  with your health care provider as directed.  SEEK MEDICAL CARE IF:   · You have increasing cramps or pain that is not relieved with medicine.  · You have abdominal pain that does not seem to be related to the same area of earlier cramping and pain.  · You have bad smelling vaginal discharge.  · You have a rash.  · You are having problems with any medicine.  SEEK IMMEDIATE MEDICAL CARE IF:   · You have bleeding that is heavier than a normal menstrual period.  · You have a fever.  · You have chest pain.  · You have shortness of breath.  · You feel dizzy or feel like fainting.  · You pass out.  · You have pain in your shoulder strap area.  · You have heavy vaginal bleeding with or without blood clots.  Document Released: 05/09/2000 Document Revised: 03/02/2013 Document Reviewed: 12/09/2012  Exit[employer]® Patient Information ©2014 [employer].

## 2013-11-06 NOTE — MAU Note (Signed)
32yo presents to MAU with complaints of heavy vaginal bleeding that started Thursday night and she was evaluated in MAU on Friday for the heavy bleeding and given cytotec for an [name].Marland Kitchen. Ultrasound showed retained products at that time. Pt states that she was told if the vaginal bleeding didn't slow down to come back in today for a D&C.
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
## 2013-11-06 NOTE — Op Note (Signed)
Tammy Robinson  PROCEDURE DATE: 11/06/2013  PREOPERATIVE DIAGNOSIS: Retained POC following termination and ABL anemia  POSTOPERATIVE DIAGNOSIS: The same.  PROCEDURE:    Suction Dilation and Evacuation.  SURGEON:  Ashyra Cantin S  ANESTHESIA: MAOcie Bob- Carrington, MD  INDICATIONS: 33 y.o. [id number] with TAB 2 wks ago.  Here for second time in 2 days with heavy vaginal bleeding, ABL anemia with Hgb drop from 13-8 after Cytotec. Risks of surgery were discussed with the patient including but not limited to: bleeding which may require transfusion; infection which may require antibiotics; injury to uterus or surrounding organs;need for additional procedures including laparotomy or laparoscopy; possibility of intrauterine scarring which may impair future fertility; and other postoperative/anesthesia complications. Written informed consent was obtained.    FINDINGS:  A 10 wk size anteverted midline uterus, small amounts of products of conception, specimen sent to pathology.  ANESTHESIA:    Monitored intravenous sedation, paracervical block.  ESTIMATED BLOOD LOSS:  Less than 20 ml.  SPECIMENS:  Products of conception sent to pathology  COMPLICATIONS:  None immediate.  PROCEDURE DETAILS:  The patient received intravenous antibiotics while in the preoperative area.  She was then taken to the operating room where general anesthesia was administered and was found to be adequate.  After an adequate timeout was performed, she was placed in the dorsal lithotomy position and examined; then prepped and draped in the sterile manner.   Her bladder was catheterized for an unmeasured amount of clear, yellow urine. A vaginal speculum was then placed in the patient's vagina and a single tooth tenaculum was applied to the anterior lip of the cervix.  A paracervical block using 1% Lidocaine with Epinephrine was administered. The cervix was gently dilated to accommodate a 10 mm suction curette that was gently advanced to the  uterine fundus.  The suction device was then activated and curette slowly rotated to clear the uterus of products of conception.  A sharp curettage was then performed to confirm complete emptying of the uterus.There was minimal bleeding noted and the tenaculum removed with good hemostasis noted. The patient tolerated the procedure well.  The patient was taken to the recovery area in stable condition.  Daniil Labarge S 11/06/2013 3:00 PM

## 2013-11-06 NOTE — Transfer of Care (Signed)
Immediate Anesthesia Transfer of Care Note  Patient: Tammy Robinson  Procedure(s) Performed: Procedure(s): DILATATION AND EVACUATION (N/A)  Patient Location: PACU  Anesthesia Type:General  Level of Consciousness: awake and alert   Airway & Oxygen Therapy: Patient Spontanous Breathing and Patient connected to nasal cannula oxygen  Post-op Assessment: Report given to PACU RN and Post -op Vital signs reviewed and stable  Post vital signs: Reviewed and stable  Complications: No apparent anesthesia complications

## 2013-11-07 ENCOUNTER — Encounter (HOSPITAL_COMMUNITY): Payer: Self-pay | Admitting: Family Medicine

## 2013-11-08 ENCOUNTER — Encounter: Payer: Self-pay | Admitting: Family Medicine

## 2013-11-21 ENCOUNTER — Ambulatory Visit: Payer: Medicaid Other | Admitting: Obstetrics & Gynecology

## 2013-11-21 ENCOUNTER — Telehealth: Payer: Self-pay | Admitting: *Deleted

## 2013-11-21 ENCOUNTER — Encounter: Payer: Self-pay | Admitting: *Deleted

## 2013-11-21 NOTE — Telephone Encounter (Signed)
Attempted to call patient for missed appointment, no answer, left message and sent letter for patient to contact our office.

## 2014-01-06 ENCOUNTER — Encounter: Payer: Self-pay | Admitting: General Practice

## 2014-01-13 ENCOUNTER — Encounter: Payer: Self-pay | Admitting: General Practice

## 2014-03-27 ENCOUNTER — Encounter (HOSPITAL_COMMUNITY): Payer: Self-pay | Admitting: Family Medicine

## 2014-05-26 NOTE — L&D Delivery Note (Signed)
Delivery Note At 6:13 AM a viable female was delivered via Vaginal, Spontaneous Delivery (Presentation: Middle Occiput Anterior).  APGAR: 8, 9; weight pending.   Placenta status: Intact, Manual removal.  Cord: 3 vessels with the following complications: None.  Mild postpartum hemorrhage managed with uterine massage, IV pitocin and rectal cytotec.  Anesthesia: Epidural  Episiotomy: None Lacerations: None Suture Repair: none Est. Blood Loss (mL): 600  Mom to postpartum.  Baby to Couplet care / Skin to Skin.  Numan Zylstra D 12/27/2014, 6:45 AM

## 2014-06-27 LAB — OB RESULTS CONSOLE RUBELLA ANTIBODY, IGM: Rubella: NON-IMMUNE/NOT IMMUNE

## 2014-06-27 LAB — OB RESULTS CONSOLE HIV ANTIBODY (ROUTINE TESTING): HIV: NONREACTIVE

## 2014-06-27 LAB — OB RESULTS CONSOLE RPR: RPR: NONREACTIVE

## 2014-06-27 LAB — OB RESULTS CONSOLE GC/CHLAMYDIA
Chlamydia: NEGATIVE
Gonorrhea: NEGATIVE

## 2014-06-27 LAB — OB RESULTS CONSOLE HEPATITIS B SURFACE ANTIGEN: Hepatitis B Surface Ag: NEGATIVE

## 2014-06-27 LAB — OB RESULTS CONSOLE ANTIBODY SCREEN: Antibody Screen: POSITIVE

## 2014-07-20 ENCOUNTER — Inpatient Hospital Stay (HOSPITAL_COMMUNITY)
Admission: AD | Admit: 2014-07-20 | Discharge: 2014-07-20 | Disposition: A | Discharge status: Home / Self Care | Payer: Medicaid Other | Source: Ambulatory Visit | Attending: Obstetrics and Gynecology | Admitting: Obstetrics and Gynecology

## 2014-07-20 ENCOUNTER — Encounter (HOSPITAL_COMMUNITY): Payer: Self-pay | Admitting: *Deleted

## 2014-07-20 DIAGNOSIS — Z87891 Personal history of nicotine dependence: Secondary | ICD-10-CM | POA: Diagnosis not present

## 2014-07-20 DIAGNOSIS — O10011 Pre-existing essential hypertension complicating pregnancy, first trimester: Secondary | ICD-10-CM | POA: Diagnosis not present

## 2014-07-20 DIAGNOSIS — Z3A17 17 weeks gestation of pregnancy: Secondary | ICD-10-CM | POA: Diagnosis not present

## 2014-07-20 DIAGNOSIS — R109 Unspecified abdominal pain: Secondary | ICD-10-CM | POA: Insufficient documentation

## 2014-07-20 DIAGNOSIS — O9989 Other specified diseases and conditions complicating pregnancy, childbirth and the puerperium: Secondary | ICD-10-CM | POA: Diagnosis not present

## 2014-07-20 DIAGNOSIS — O26899 Other specified pregnancy related conditions, unspecified trimester: Secondary | ICD-10-CM

## 2014-07-20 LAB — CBC WITH DIFFERENTIAL/PLATELET
BASOS ABS: 0 10*3/uL (ref 0.0–0.1)
Basophils Relative: 0 % (ref 0–1)
EOS PCT: 4 % (ref 0–5)
Eosinophils Absolute: 0.5 10*3/uL (ref 0.0–0.7)
HCT: 34.9 % — ABNORMAL LOW (ref 36.0–46.0)
HEMOGLOBIN: 11.8 g/dL — AB (ref 12.0–15.0)
LYMPHS PCT: 27 % (ref 12–46)
Lymphs Abs: 3.4 10*3/uL (ref 0.7–4.0)
MCH: 30.7 pg (ref 26.0–34.0)
MCHC: 33.8 g/dL (ref 30.0–36.0)
MCV: 90.9 fL (ref 78.0–100.0)
Monocytes Absolute: 0.8 10*3/uL (ref 0.1–1.0)
Monocytes Relative: 6 % (ref 3–12)
Neutro Abs: 7.8 10*3/uL — ABNORMAL HIGH (ref 1.7–7.7)
Neutrophils Relative %: 63 % (ref 43–77)
Platelets: 284 10*3/uL (ref 150–400)
RBC: 3.84 MIL/uL — ABNORMAL LOW (ref 3.87–5.11)
RDW: 16.9 % — ABNORMAL HIGH (ref 11.5–15.5)
WBC: 12.4 10*3/uL — AB (ref 4.0–10.5)

## 2014-07-20 LAB — URINALYSIS, ROUTINE W REFLEX MICROSCOPIC
BILIRUBIN URINE: NEGATIVE
Glucose, UA: NEGATIVE mg/dL
HGB URINE DIPSTICK: NEGATIVE
Ketones, ur: NEGATIVE mg/dL
Nitrite: NEGATIVE
PROTEIN: NEGATIVE mg/dL
Specific Gravity, Urine: 1.01 (ref 1.005–1.030)
Urobilinogen, UA: 0.2 mg/dL (ref 0.0–1.0)
pH: 6 (ref 5.0–8.0)

## 2014-07-20 LAB — URINE MICROSCOPIC-ADD ON

## 2014-07-20 MED ORDER — HYDROCODONE-ACETAMINOPHEN 5-325 MG PO TABS
1.0000 | ORAL_TABLET | Freq: Once | ORAL | Status: AC
  Administered 2014-07-20: 1 via ORAL
  Filled 2014-07-20: qty 1

## 2014-07-20 NOTE — MAU Note (Signed)
Pt reports having abd pain that started yesterday. Got a lot worse today. Pain is constant in lower abd. Pt reports good fetal movement. Denies vag bleeding or discharge.

## 2014-07-20 NOTE — MAU Note (Signed)
Pain in lower abd, started yesterday. Worse today.   Pt states achy when laying down- but is worse when up- sometimes shooting to back

## 2014-07-20 NOTE — Discharge Instructions (Signed)
Call the office tomorrow to schedule an appointment for follow up.
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
## 2014-11-08 ENCOUNTER — Ambulatory Visit: Payer: Medicaid Other

## 2014-11-22 ENCOUNTER — Encounter: Payer: Medicaid Other | Attending: Obstetrics and Gynecology

## 2014-11-22 DIAGNOSIS — O24410 Gestational diabetes mellitus in pregnancy, diet controlled: Secondary | ICD-10-CM | POA: Diagnosis not present

## 2014-11-22 DIAGNOSIS — Z713 Dietary counseling and surveillance: Secondary | ICD-10-CM | POA: Diagnosis not present

## 2014-11-24 NOTE — Progress Notes (Signed)
  Patient was seen on 11/22/14 for Gestational Diabetes self-management class at the [facility]. The following learning objectives were met by the patient during this course:   States the definition of Gestational Diabetes  States why dietary management is important in controlling blood glucose  Describes the effects each nutrient has on blood glucose levels  Demonstrates ability to create a balanced meal plan  Demonstrates carbohydrate counting   States when to check blood glucose levels  Demonstrates proper blood glucose monitoring techniques  States the effect of stress and exercise on blood glucose levels  States the importance of limiting caffeine and abstaining from alcohol and smoking  Blood glucose monitor given: Accu [employer]k [employer] BG Monitoring Kit Lot # [id number] Exp: 09/23/15 Blood glucose reading: 77 mg/dl  Patient instructed to monitor glucose levels: FBS: 60 - <90 1 hour: <140  *Patient received handouts:  Nutrition Diabetes and Pregnancy  Carbohydrate Counting List  Patient will be seen for follow-up as needed.

## 2014-12-08 NOTE — Progress Notes (Signed)
Meter given on 11/22/14 was faulty  Replaced broken meter with Accu [employer]k [employer] BG Monitoring Kit Lot: [id number] Exp: 10/24/15  Lynden Ang, MS, RD, CSP, CDE, LDN

## 2014-12-25 ENCOUNTER — Inpatient Hospital Stay (HOSPITAL_COMMUNITY): Admission: RE | Admit: 2014-12-25 | Payer: Medicaid Other | Source: Ambulatory Visit
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
## 2014-12-26 ENCOUNTER — Encounter (HOSPITAL_COMMUNITY): Payer: Self-pay

## 2014-12-26 ENCOUNTER — Inpatient Hospital Stay (HOSPITAL_COMMUNITY): Payer: Medicaid Other | Admitting: Anesthesiology

## 2014-12-26 ENCOUNTER — Inpatient Hospital Stay (HOSPITAL_COMMUNITY)
Admission: RE | Admit: 2014-12-26 | Discharge: 2014-12-29 | DRG: 767 | Disposition: A | Discharge status: Home / Self Care | Payer: Medicaid Other | Source: Ambulatory Visit | Attending: Obstetrics and Gynecology | Admitting: Obstetrics and Gynecology

## 2014-12-26 DIAGNOSIS — O1092 Unspecified pre-existing hypertension complicating childbirth: Principal | ICD-10-CM | POA: Diagnosis present

## 2014-12-26 DIAGNOSIS — O24429 Gestational diabetes mellitus in childbirth, unspecified control: Secondary | ICD-10-CM | POA: Diagnosis present

## 2014-12-26 DIAGNOSIS — Z833 Family history of diabetes mellitus: Secondary | ICD-10-CM

## 2014-12-26 DIAGNOSIS — Z8249 Family history of ischemic heart disease and other diseases of the circulatory system: Secondary | ICD-10-CM | POA: Diagnosis not present

## 2014-12-26 DIAGNOSIS — Z87891 Personal history of nicotine dependence: Secondary | ICD-10-CM | POA: Diagnosis not present

## 2014-12-26 DIAGNOSIS — Z3A40 40 weeks gestation of pregnancy: Secondary | ICD-10-CM | POA: Diagnosis present

## 2014-12-26 DIAGNOSIS — Z823 Family history of stroke: Secondary | ICD-10-CM

## 2014-12-26 DIAGNOSIS — Z79899 Other long term (current) drug therapy: Secondary | ICD-10-CM | POA: Diagnosis not present

## 2014-12-26 DIAGNOSIS — O163 Unspecified maternal hypertension, third trimester: Secondary | ICD-10-CM | POA: Diagnosis present

## 2014-12-26 LAB — CBC
HCT: 37.2 % (ref 36.0–46.0)
HCT: 38.5 % (ref 36.0–46.0)
Hemoglobin: 12.7 g/dL (ref 12.0–15.0)
Hemoglobin: 13.1 g/dL (ref 12.0–15.0)
MCH: 32 pg (ref 26.0–34.0)
MCH: 32 pg (ref 26.0–34.0)
MCHC: 34 g/dL (ref 30.0–36.0)
MCHC: 34.1 g/dL (ref 30.0–36.0)
MCV: 93.7 fL (ref 78.0–100.0)
MCV: 93.9 fL (ref 78.0–100.0)
Platelets: 375 10*3/uL (ref 150–400)
Platelets: 395 10*3/uL (ref 150–400)
RBC: 3.97 MIL/uL (ref 3.87–5.11)
RBC: 4.1 MIL/uL (ref 3.87–5.11)
RDW: 15.4 % (ref 11.5–15.5)
RDW: 15.5 % (ref 11.5–15.5)
WBC: 17.9 10*3/uL — ABNORMAL HIGH (ref 4.0–10.5)
WBC: 18.2 10*3/uL — ABNORMAL HIGH (ref 4.0–10.5)

## 2014-12-26 LAB — COMPREHENSIVE METABOLIC PANEL
ALBUMIN: 2.8 g/dL — AB (ref 3.5–5.0)
ALT: 13 U/L — AB (ref 14–54)
ANION GAP: 4 — AB (ref 5–15)
AST: 23 U/L (ref 15–41)
Alkaline Phosphatase: 147 U/L — ABNORMAL HIGH (ref 38–126)
BUN: 7 mg/dL (ref 6–20)
CHLORIDE: 107 mmol/L (ref 101–111)
CO2: 22 mmol/L (ref 22–32)
CREATININE: 0.57 mg/dL (ref 0.44–1.00)
Calcium: 8.7 mg/dL — ABNORMAL LOW (ref 8.9–10.3)
GFR calc Af Amer: >60 mL/min (ref >60)
GFR calc non Af Amer: >60 mL/min (ref >60)
Glucose, Bld: 102 mg/dL — ABNORMAL HIGH (ref 65–99)
Potassium: 3.7 mmol/L (ref 3.5–5.1)
Sodium: 133 mmol/L — ABNORMAL LOW (ref 135–145)
Total Bilirubin: 0.4 mg/dL (ref 0.3–1.2)
Total Protein: 6.2 g/dL — ABNORMAL LOW (ref 6.5–8.1)

## 2014-12-26 LAB — GLUCOSE, CAPILLARY
GLUCOSE-CAPILLARY: 93 mg/dL (ref 65–99)
GLUCOSE-CAPILLARY: 94 mg/dL (ref 65–99)
Glucose-Capillary: 100 mg/dL — ABNORMAL HIGH (ref 65–99)
Glucose-Capillary: 83 mg/dL (ref 65–99)
Glucose-Capillary: 88 mg/dL (ref 65–99)
Glucose-Capillary: 92 mg/dL (ref 65–99)

## 2014-12-26 LAB — RPR: RPR: NONREACTIVE

## 2014-12-26 LAB — OB RESULTS CONSOLE ANTIBODY SCREEN: Antibody Screen: POSITIVE

## 2014-12-26 LAB — OB RESULTS CONSOLE GBS: GBS: NEGATIVE

## 2014-12-26 LAB — PREPARE RBC (CROSSMATCH)

## 2014-12-26 MED ORDER — FENTANYL 2.5 MCG/ML BUPIVACAINE 1/10 % EPIDURAL INFUSION (WH - ANES)
14.0000 mL/h | INTRAMUSCULAR | Status: DC | PRN
  Administered 2014-12-26 – 2014-12-27 (×4): 14 mL/h via EPIDURAL
  Filled 2014-12-26 (×3): qty 125

## 2014-12-26 MED ORDER — OXYCODONE-ACETAMINOPHEN 5-325 MG PO TABS: 1.0000 | ORAL_TABLET | ORAL | Status: DC | PRN

## 2014-12-26 MED ORDER — PHENYLEPHRINE 40 MCG/ML (10ML) SYRINGE FOR IV PUSH (FOR BLOOD PRESSURE SUPPORT)
80.0000 ug | PREFILLED_SYRINGE | INTRAVENOUS | Status: DC | PRN
  Filled 2014-12-26: qty 20
  Filled 2014-12-26: qty 2

## 2014-12-26 MED ORDER — ONDANSETRON HCL 4 MG/2ML IJ SOLN
4.0000 mg | Freq: Four times a day (QID) | INTRAMUSCULAR | Status: DC | PRN
  Administered 2014-12-26: 4 mg via INTRAVENOUS
  Filled 2014-12-26: qty 2

## 2014-12-26 MED ORDER — BUTORPHANOL TARTRATE 1 MG/ML IJ SOLN
1.0000 mg | INTRAMUSCULAR | Status: DC | PRN
  Administered 2014-12-26: 1 mg via INTRAVENOUS
  Filled 2014-12-26: qty 1

## 2014-12-26 MED ORDER — LACTATED RINGERS IV SOLN
500.0000 mL | INTRAVENOUS | Status: DC | PRN
  Administered 2014-12-27 (×2): 500 mL via INTRAVENOUS

## 2014-12-26 MED ORDER — LIDOCAINE HCL (PF) 1 % IJ SOLN
30.0000 mL | INTRAMUSCULAR | Status: DC | PRN
  Filled 2014-12-26: qty 30

## 2014-12-26 MED ORDER — OXYTOCIN BOLUS FROM INFUSION
500.0000 mL | INTRAVENOUS | Status: DC
  Administered 2014-12-27: 500 mL via INTRAVENOUS

## 2014-12-26 MED ORDER — OXYCODONE-ACETAMINOPHEN 5-325 MG PO TABS
2.0000 | ORAL_TABLET | ORAL | Status: DC | PRN
  Administered 2014-12-27: 2 via ORAL
  Filled 2014-12-26: qty 2

## 2014-12-26 MED ORDER — EPHEDRINE 5 MG/ML INJ
10.0000 mg | INTRAVENOUS | Status: DC | PRN
  Filled 2014-12-26: qty 2

## 2014-12-26 MED ORDER — LABETALOL HCL 5 MG/ML IV SOLN: 20.0000 mg | INTRAVENOUS | Status: DC | PRN

## 2014-12-26 MED ORDER — DIPHENHYDRAMINE HCL 50 MG/ML IJ SOLN: 12.5000 mg | INTRAMUSCULAR | Status: DC | PRN

## 2014-12-26 MED ORDER — CITRIC ACID-SODIUM CITRATE 334-500 MG/5ML PO SOLN
30.0000 mL | ORAL | Status: DC | PRN
  Filled 2014-12-26: qty 15

## 2014-12-26 MED ORDER — TERBUTALINE SULFATE 1 MG/ML IJ SOLN: 0.2500 mg | Freq: Once | INTRAMUSCULAR | Status: AC | PRN

## 2014-12-26 MED ORDER — OXYTOCIN 40 UNITS IN LACTATED RINGERS INFUSION - SIMPLE MED: 62.5000 mL/h | INTRAVENOUS | Status: DC

## 2014-12-26 MED ORDER — LACTATED RINGERS IV SOLN
INTRAVENOUS | Status: DC
Start: 2014-12-26 — End: 2014-12-27
  Administered 2014-12-26 – 2014-12-27 (×5): via INTRAVENOUS

## 2014-12-26 MED ORDER — BUTORPHANOL TARTRATE 1 MG/ML IJ SOLN: 1.0000 mg | INTRAMUSCULAR | Status: DC | PRN

## 2014-12-26 MED ORDER — OXYTOCIN 40 UNITS IN LACTATED RINGERS INFUSION - SIMPLE MED
1.0000 m[IU]/min | INTRAVENOUS | Status: DC
  Administered 2014-12-26: 2 m[IU]/min via INTRAVENOUS
  Filled 2014-12-26: qty 1000

## 2014-12-26 MED ORDER — ZOLPIDEM TARTRATE 5 MG PO TABS
5.0000 mg | ORAL_TABLET | Freq: Every evening | ORAL | Status: DC | PRN
Start: 2014-12-26 — End: 2014-12-27
  Administered 2014-12-26: 5 mg via ORAL
  Filled 2014-12-26: qty 1

## 2014-12-26 MED ORDER — MISOPROSTOL 25 MCG QUARTER TABLET
25.0000 ug | ORAL_TABLET | ORAL | Status: DC | PRN
  Administered 2014-12-26: 25 ug via VAGINAL
  Filled 2014-12-26: qty 0.25
  Filled 2014-12-26: qty 1

## 2014-12-26 MED ORDER — HYDRALAZINE HCL 20 MG/ML IJ SOLN: 10.0000 mg | Freq: Once | INTRAMUSCULAR | Status: AC | PRN

## 2014-12-26 MED ORDER — LIDOCAINE HCL (PF) 1 % IJ SOLN
INTRAMUSCULAR | Status: DC | PRN
  Administered 2014-12-26: 3 mL via EPIDURAL
  Administered 2014-12-26: 2 mL via EPIDURAL
  Administered 2014-12-26: 5 mL

## 2014-12-26 NOTE — Plan of Care (Signed)
Problem: Consults Goal: Birthing Suites Patient Information Press F2 to bring up selections list  Pt > [redacted] weeks EGA, Inpatient induction, PIH (Pregnancy induced hypertension) and Diabetic

## 2014-12-26 NOTE — Progress Notes (Signed)
Comfortable with epidural Afeb, VSS, BP 140-150/80-100 FHT- Cat I, ctx q 2-3 min on 6 mu/min pitocin VE-3/50/-3, vtx, definite AROM clear, IUPC placed Continue pitocin, monitor progress

## 2014-12-26 NOTE — Anesthesia Procedure Notes (Signed)
Epidural Patient location during procedure: OB  Staffing Anesthesiologist: Cecile Hearing Performed by: anesthesiologist   Preanesthetic Checklist Completed: patient identified, site marked, surgical consent, pre-op evaluation, timeout performed, IV checked, risks and benefits discussed and monitors and equipment checked  Epidural Approach: midline Location: L3-L4  Needle:  Needle type: Tuohy  Needle gauge: 17 G Needle length: 9 cm Needle insertion depth: 6 cm Catheter at skin depth: 10 cm Test dose: negative  Assessment Events: blood not aspirated, injection not painful, no injection resistance, negative IV test and no paresthesia  Additional Notes Uncomplicated insertion. Negative csf/heme through epidural catheterReason for block:procedure for pain

## 2014-12-26 NOTE — Plan of Care (Signed)
Problem: Phase I Progression Outcomes Goal: Other Phase I Outcomes/Goals Outcome: Completed/Met Date Met:  12/26/14 Nurse discussed with patient signs and symptoms related to preeclampsia. Patient verbalized understanding. Patient given preeclampsia eduction tool.

## 2014-12-26 NOTE — Progress Notes (Signed)
Feeling some ctx.  Received one dose of cytotec Afeb, VSS, BP 130-160/90 CBG all normal so far FHT- Cat I VE-1-2/30/-3, vtx Will start pitocin, plan AROM when more dilated, continue CBGs for now, monitor BP.  Will make sure has T&C for 2 units since has anti-Kell antibody

## 2014-12-26 NOTE — Plan of Care (Signed)
Problem: Consults Goal: Birthing Suites Patient Information Press F2 to bring up selections list  Pt > [redacted] weeks EGA, Inpatient induction, PIH (Pregnancy induced hypertension) and Diabetic         

## 2014-12-26 NOTE — Anesthesia Preprocedure Evaluation (Signed)
Anesthesia Evaluation  Patient identified by MRN, date of birth, ID band Patient awake    Reviewed: Allergy & Precautions, NPO status , Patient's Chart, lab work & pertinent test results  Airway Mallampati: II   Neck ROM: Full    Dental  (+) Teeth Intact   Pulmonary former smoker,    Pulmonary exam normal       Cardiovascular hypertension, Normal cardiovascular exam    Neuro/Psych  Headaches, Anxiety    GI/Hepatic   Endo/Other  diabetes, Gestational  Renal/GU      Musculoskeletal   Abdominal   Peds  Hematology  (+) anemia ,   Anesthesia Other Findings   Reproductive/Obstetrics (+) Pregnancy                             Anesthesia Physical Anesthesia Plan  ASA: II  Anesthesia Plan: Epidural   Post-op Pain Management:    Induction:   Airway Management Planned:   Additional Equipment:   Intra-op Plan:   Post-operative Plan:   Informed Consent: I have reviewed the patients History and Physical, chart, labs and discussed the procedure including the risks, benefits and alternatives for the proposed anesthesia with the patient or authorized representative who has indicated his/her understanding and acceptance.   Dental advisory given  Plan Discussed with:   Anesthesia Plan Comments:         Anesthesia Quick Evaluation

## 2014-12-26 NOTE — Progress Notes (Signed)
Comfortble with epidural Afeb, VSS, BP 140-160/90 FHT- Cat I, ctx q 2-3 min on 2 mu/min pitocin VE-3/50/-2, vtx, attempted AROM, no fluid Continue pitocin, monitor progress, monitor BP and treat if needed, will d/c CBG since all normal

## 2014-12-26 NOTE — Progress Notes (Signed)
Dr Jackelyn Knife updated on FHR, UC pattern, pitocin on 2 milliunits/min, pt BPs, and pt desire for IV pain medication administration.  Orders given for 1 mg stadol IV push q1h prn.

## 2014-12-26 NOTE — Progress Notes (Signed)
Comfortable Afeb, VSS, BP normal FHT- Cat I, ctx q 2-3 min VE-4/50/-2, vtx is a bit more applied, IUPC was removed shortly after insertion-was not working well Continue pitocin, monitor progress, hopefully she will get into active labor soon.  She had 2 prior inductions and one spontaneous labor after ROM, longest labor went 6 hours, I am not sure why we are having trouble getting her into active labor.  Largest baby 8 lbs 13oz, this baby feels at least that big.

## 2014-12-27 ENCOUNTER — Encounter (HOSPITAL_COMMUNITY): Payer: Self-pay

## 2014-12-27 LAB — CBC
HCT: 34.6 % — ABNORMAL LOW (ref 36.0–46.0)
Hemoglobin: 11.4 g/dL — ABNORMAL LOW (ref 12.0–15.0)
MCH: 31.1 pg (ref 26.0–34.0)
MCHC: 32.9 g/dL (ref 30.0–36.0)
MCV: 94.5 fL (ref 78.0–100.0)
PLATELETS: 327 10*3/uL (ref 150–400)
RBC: 3.66 MIL/uL — ABNORMAL LOW (ref 3.87–5.11)
RDW: 15.5 % (ref 11.5–15.5)
WBC: 27.8 10*3/uL — AB (ref 4.0–10.5)

## 2014-12-27 MED ORDER — ONDANSETRON HCL 4 MG PO TABS: 4.0000 mg | ORAL_TABLET | ORAL | Status: DC | PRN

## 2014-12-27 MED ORDER — PRENATAL MULTIVITAMIN CH
1.0000 | ORAL_TABLET | Freq: Every day | ORAL | Status: DC
  Administered 2014-12-27 – 2014-12-28 (×2): 1 via ORAL
  Filled 2014-12-27 (×2): qty 1

## 2014-12-27 MED ORDER — MAGNESIUM HYDROXIDE 400 MG/5ML PO SUSP: 30.0000 mL | ORAL | Status: DC | PRN

## 2014-12-27 MED ORDER — PANTOPRAZOLE SODIUM 20 MG PO TBEC
20.0000 mg | DELAYED_RELEASE_TABLET | Freq: Every day | ORAL | Status: DC
  Administered 2014-12-28 – 2014-12-29 (×2): 20 mg via ORAL
  Filled 2014-12-27 (×2): qty 1

## 2014-12-27 MED ORDER — SENNOSIDES-DOCUSATE SODIUM 8.6-50 MG PO TABS
2.0000 | ORAL_TABLET | ORAL | Status: DC
  Administered 2014-12-28 – 2014-12-29 (×2): 2 via ORAL
  Filled 2014-12-27 (×2): qty 2

## 2014-12-27 MED ORDER — ZOLPIDEM TARTRATE 5 MG PO TABS: 5.0000 mg | ORAL_TABLET | Freq: Every evening | ORAL | Status: DC | PRN

## 2014-12-27 MED ORDER — TETANUS-DIPHTH-ACELL PERTUSSIS 5-2.5-18.5 LF-MCG/0.5 IM SUSP: 0.5000 mL | Freq: Once | INTRAMUSCULAR | Status: DC

## 2014-12-27 MED ORDER — SODIUM CHLORIDE 0.9 % IJ SOLN
3.0000 mL | INTRAMUSCULAR | Status: DC | PRN
  Administered 2014-12-27: 3 mL via INTRAVENOUS

## 2014-12-27 MED ORDER — DIPHENHYDRAMINE HCL 25 MG PO CAPS: 25.0000 mg | ORAL_CAPSULE | Freq: Four times a day (QID) | ORAL | Status: DC | PRN

## 2014-12-27 MED ORDER — MISOPROSTOL 200 MCG PO TABS
1000.0000 ug | ORAL_TABLET | Freq: Once | ORAL | Status: AC
  Administered 2014-12-27: 1000 ug via RECTAL

## 2014-12-27 MED ORDER — ONDANSETRON HCL 4 MG/2ML IJ SOLN: 4.0000 mg | INTRAMUSCULAR | Status: DC | PRN

## 2014-12-27 MED ORDER — WITCH HAZEL-GLYCERIN EX PADS
1.0000 "application " | MEDICATED_PAD | CUTANEOUS | Status: DC | PRN
  Administered 2014-12-29: 1 via TOPICAL

## 2014-12-27 MED ORDER — LANOLIN HYDROUS EX OINT: TOPICAL_OINTMENT | CUTANEOUS | Status: DC | PRN

## 2014-12-27 MED ORDER — TERBUTALINE SULFATE 1 MG/ML IJ SOLN
INTRAMUSCULAR | Status: AC
  Filled 2014-12-27: qty 1

## 2014-12-27 MED ORDER — METHYLERGONOVINE MALEATE 0.2 MG/ML IJ SOLN: 0.2000 mg | INTRAMUSCULAR | Status: DC | PRN

## 2014-12-27 MED ORDER — METHYLERGONOVINE MALEATE 0.2 MG PO TABS: 0.2000 mg | ORAL_TABLET | ORAL | Status: DC | PRN

## 2014-12-27 MED ORDER — MEASLES, MUMPS & RUBELLA VAC ~~LOC~~ INJ
0.5000 mL | INJECTION | Freq: Once | SUBCUTANEOUS | Status: AC
  Administered 2014-12-29: 0.5 mL via SUBCUTANEOUS
  Filled 2014-12-27 (×2): qty 0.5

## 2014-12-27 MED ORDER — ACETAMINOPHEN 325 MG PO TABS: 650.0000 mg | ORAL_TABLET | ORAL | Status: DC | PRN

## 2014-12-27 MED ORDER — BENZOCAINE-MENTHOL 20-0.5 % EX AERO
1.0000 "application " | INHALATION_SPRAY | CUTANEOUS | Status: DC | PRN
  Administered 2014-12-27: 1 via TOPICAL
  Filled 2014-12-27: qty 56

## 2014-12-27 MED ORDER — OXYCODONE-ACETAMINOPHEN 5-325 MG PO TABS
1.0000 | ORAL_TABLET | ORAL | Status: DC | PRN
  Administered 2014-12-27: 2 via ORAL
  Administered 2014-12-27 – 2014-12-28 (×3): 1 via ORAL
  Administered 2014-12-28: 2 via ORAL
  Administered 2014-12-28: 1 via ORAL
  Administered 2014-12-28: 2 via ORAL
  Administered 2014-12-28: 1 via ORAL
  Administered 2014-12-29: 2 via ORAL
  Administered 2014-12-29: 1 via ORAL
  Filled 2014-12-27: qty 2
  Filled 2014-12-27 (×3): qty 1
  Filled 2014-12-27: qty 2
  Filled 2014-12-27: qty 1
  Filled 2014-12-27: qty 2
  Filled 2014-12-27 (×2): qty 1
  Filled 2014-12-27: qty 2

## 2014-12-27 MED ORDER — DIBUCAINE 1 % RE OINT: 1.0000 "application " | TOPICAL_OINTMENT | RECTAL | Status: DC | PRN

## 2014-12-27 MED ORDER — SIMETHICONE 80 MG PO CHEW: 80.0000 mg | CHEWABLE_TABLET | ORAL | Status: DC | PRN

## 2014-12-27 MED ORDER — MISOPROSTOL 200 MCG PO TABS
ORAL_TABLET | ORAL | Status: AC
  Filled 2014-12-27: qty 5

## 2014-12-27 MED ORDER — IBUPROFEN 600 MG PO TABS
600.0000 mg | ORAL_TABLET | Freq: Four times a day (QID) | ORAL | Status: DC
  Administered 2014-12-27 – 2014-12-29 (×8): 600 mg via ORAL
  Filled 2014-12-27 (×8): qty 1

## 2014-12-27 NOTE — Anesthesia Postprocedure Evaluation (Signed)
  Anesthesia Post-op Note  Patient: Tammy Robinson  Procedure(s) Performed: * No procedures listed *  Patient Location: Mother/Baby  Anesthesia Type:Epidural  Level of Consciousness: awake, alert  and oriented  Airway and Oxygen Therapy: Patient Spontanous Breathing  Post-op Pain: none  Post-op Assessment: Post-op Vital signs reviewed and Patient's Cardiovascular Status Stable              Post-op Vital Signs: Reviewed and stable  Last Vitals:  Filed Vitals:   12/27/14 0940  BP: 140/80  Pulse: 79  Temp: 36.7 C  Resp:     Complications: No apparent anesthesia complications

## 2014-12-27 NOTE — Progress Notes (Signed)
MOB was referred for history of depression/anxiety. * Referral screened out by Clinical Social Worker because none of the following criteria appear to apply: ~ History of anxiety/depression during this pregnancy, or of post-partum depression. ~ Diagnosis of anxiety and/or depression within last 3 years OR * MOB's symptoms currently being treated with medication and/or therapy. Please contact the Clinical Social Worker if needs arise, or if MOB requests.   

## 2014-12-27 NOTE — Progress Notes (Signed)
Comfortable Afeb, VSS, BP 110-160/70-100 FHT- Cat II, variable decels, some prolonged, mod variability VE-5-6/80/0, vtx Variable decels most likely due to vertex finally descending into pelvis and cervix changing.  Pitocin is off for now, will monitor FHT closely, anticipate rapid progress.

## 2014-12-27 NOTE — Progress Notes (Signed)
Pt requesting prilosec in am  Dr Senaida Ores called and order received

## 2014-12-28 LAB — CBC
HCT: 29.7 % — ABNORMAL LOW (ref 36.0–46.0)
Hemoglobin: 9.9 g/dL — ABNORMAL LOW (ref 12.0–15.0)
MCH: 31 pg (ref 26.0–34.0)
MCHC: 33.3 g/dL (ref 30.0–36.0)
MCV: 93.1 fL (ref 78.0–100.0)
Platelets: 313 10*3/uL (ref 150–400)
RBC: 3.19 MIL/uL — ABNORMAL LOW (ref 3.87–5.11)
RDW: 15.2 % (ref 11.5–15.5)
WBC: 15.9 10*3/uL — AB (ref 4.0–10.5)

## 2014-12-28 NOTE — Progress Notes (Signed)
PPD #1 No problems Afeb, VSS, BP 130-150/80 Fundus firm, NT at U-0 Hgb 11.4 to 9.9 Continue routine postpartum care

## 2014-12-28 NOTE — Progress Notes (Signed)
Patient ID: [name], female   DOB: Oct 13, 1980, 34 y.o.   MRN: [id number] Pt c/o pain in lower abdomen more to the right. Abdominal exam is normal and VS are normal. The pt is ambulating, tolerating a regular diet and passing flatus

## 2014-12-29 MED ORDER — IBUPROFEN 600 MG PO TABS: 600.0000 mg | ORAL_TABLET | Freq: Four times a day (QID) | ORAL | Status: DC

## 2014-12-29 MED ORDER — OXYCODONE-ACETAMINOPHEN 5-325 MG PO TABS: 1.0000 | ORAL_TABLET | ORAL | Status: DC | PRN

## 2014-12-29 NOTE — Discharge Summary (Signed)
Obstetric Discharge Summary Reason for Admission: induction of labor Prenatal Procedures: NST Intrapartum Procedures: spontaneous vaginal delivery Postpartum Procedures: none Complications-Operative and Postpartum: none HEMOGLOBIN  Date Value Ref Range Status  12/28/2014 9.9* 12.0 - 15.0 g/dL Final   HCT  Date Value Ref Range Status  12/28/2014 29.7* 36.0 - 46.0 % Final    Physical Exam:  General: alert Lochia: appropriate Uterine Fundus: firm  Discharge Diagnoses: Term Pregnancy-delivered and CHTN, A2GDM, anti-Kell antibodies  Discharge Information: Date: 12/29/2014 Activity: pelvic rest Diet: routine Medications: Ibuprofen and Percocet Condition: stable Instructions: refer to practice specific booklet Discharge to: home Follow-up Information    Follow up with Priscilla Kirstein D, MD. Schedule an appointment as soon as possible for a visit in 6 weeks.   Specialty:  Obstetrics and Gynecology   Contact information:   [address] [place] [address] [phone]       Newborn Data: Live born female  Birth Weight: 6 lb 5.9 oz (2889 g) APGAR: 8, 9  Home with mother.  Riki Berninger D 12/29/2014, 8:19 AM

## 2014-12-29 NOTE — Progress Notes (Signed)
PPD #2 Doing ok, still sore on right side Afeb, VSS. BP ok Fundus firm, uterus is a little tender on the right side D/c home

## 2014-12-29 NOTE — Discharge Instructions (Signed)
As per discharge pamphlet °

## 2014-12-30 LAB — TYPE AND SCREEN
ABO/RH(D): O POS
Antibody Screen: POSITIVE
DAT, IgG: NEGATIVE
DONOR AG TYPE: NEGATIVE
DONOR AG TYPE: NEGATIVE
Unit division: 0
Unit division: 0

## 2015-02-17 ENCOUNTER — Encounter (HOSPITAL_COMMUNITY): Payer: Self-pay | Admitting: Anesthesiology

## 2015-02-17 NOTE — Anesthesia Preprocedure Evaluation (Addendum)
Anesthesia Evaluation  Patient identified by MRN, date of birth, ID band Patient awake    Reviewed: Allergy & Precautions, NPO status , Patient's Chart, lab work & pertinent test results, reviewed documented beta blocker date and time   Airway Mallampati: II       Dental no notable dental hx. (+) Teeth Intact   Pulmonary neg pulmonary ROS, former smoker,    Pulmonary exam normal breath sounds clear to auscultation       Cardiovascular hypertension, Pt. on medications negative cardio ROS Normal cardiovascular exam Rhythm:Regular Rate:Normal     Neuro/Psych  Headaches, PSYCHIATRIC DISORDERS Anxiety    GI/Hepatic Neg liver ROS,   Endo/Other  diabetesHx/o GDM   Renal/GU negative Renal ROS  negative genitourinary   Musculoskeletal negative musculoskeletal ROS (+)   Abdominal   Peds  Hematology  (+) anemia ,   Anesthesia Other Findings   Reproductive/Obstetrics Desires sterilization                            Anesthesia Physical Anesthesia Plan  ASA: II  Anesthesia Plan: General   Post-op Pain Management:    Induction: Intravenous  Airway Management Planned: Oral ETT  Additional Equipment:   Intra-op Plan:   Post-operative Plan: Extubation in OR  Informed Consent: I have reviewed the patients History and Physical, chart, labs and discussed the procedure including the risks, benefits and alternatives for the proposed anesthesia with the patient or authorized representative who has indicated his/her understanding and acceptance.   Dental advisory given  Plan Discussed with: Anesthesiologist, CRNA and Surgeon  Anesthesia Plan Comments:         Anesthesia Quick Evaluation
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
## 2015-02-19 ENCOUNTER — Ambulatory Visit (HOSPITAL_COMMUNITY): Payer: Medicaid Other | Admitting: Anesthesiology

## 2015-02-19 ENCOUNTER — Ambulatory Visit (HOSPITAL_COMMUNITY)
Admission: RE | Admit: 2015-02-19 | Discharge: 2015-02-19 | Disposition: A | Discharge status: Home / Self Care | Payer: Medicaid Other | Source: Ambulatory Visit | Attending: Obstetrics and Gynecology | Admitting: Obstetrics and Gynecology

## 2015-02-19 ENCOUNTER — Encounter (HOSPITAL_COMMUNITY)
Admission: RE | Disposition: A | Discharge status: Home / Self Care | Payer: Self-pay | Source: Ambulatory Visit | Attending: Obstetrics and Gynecology

## 2015-02-19 ENCOUNTER — Encounter (HOSPITAL_COMMUNITY): Payer: Self-pay | Admitting: Emergency Medicine

## 2015-02-19 DIAGNOSIS — Z888 Allergy status to other drugs, medicaments and biological substances status: Secondary | ICD-10-CM | POA: Insufficient documentation

## 2015-02-19 DIAGNOSIS — Z885 Allergy status to narcotic agent status: Secondary | ICD-10-CM | POA: Insufficient documentation

## 2015-02-19 DIAGNOSIS — Z880 Allergy status to penicillin: Secondary | ICD-10-CM | POA: Insufficient documentation

## 2015-02-19 DIAGNOSIS — Z87891 Personal history of nicotine dependence: Secondary | ICD-10-CM | POA: Diagnosis not present

## 2015-02-19 DIAGNOSIS — I10 Essential (primary) hypertension: Secondary | ICD-10-CM | POA: Diagnosis not present

## 2015-02-19 DIAGNOSIS — Z881 Allergy status to other antibiotic agents status: Secondary | ICD-10-CM | POA: Insufficient documentation

## 2015-02-19 DIAGNOSIS — N8329 Other ovarian cysts: Secondary | ICD-10-CM | POA: Insufficient documentation

## 2015-02-19 DIAGNOSIS — F419 Anxiety disorder, unspecified: Secondary | ICD-10-CM | POA: Insufficient documentation

## 2015-02-19 DIAGNOSIS — Z302 Encounter for sterilization: Secondary | ICD-10-CM | POA: Insufficient documentation

## 2015-02-19 HISTORY — PX: LAPAROSCOPIC TUBAL LIGATION: SHX1937

## 2015-02-19 LAB — CBC
HCT: 44.2 % (ref 36.0–46.0)
HEMOGLOBIN: 14 g/dL (ref 12.0–15.0)
MCH: 29.6 pg (ref 26.0–34.0)
MCHC: 31.7 g/dL (ref 30.0–36.0)
MCV: 93.4 fL (ref 78.0–100.0)
PLATELETS: 379 10*3/uL (ref 150–400)
RBC: 4.73 MIL/uL (ref 3.87–5.11)
RDW: 15.6 % — ABNORMAL HIGH (ref 11.5–15.5)
WBC: 10.6 10*3/uL — ABNORMAL HIGH (ref 4.0–10.5)

## 2015-02-19 LAB — BASIC METABOLIC PANEL
Anion gap: 6 (ref 5–15)
BUN: 16 mg/dL (ref 6–20)
CALCIUM: 9.1 mg/dL (ref 8.9–10.3)
CO2: 27 mmol/L (ref 22–32)
CREATININE: 0.91 mg/dL (ref 0.44–1.00)
Chloride: 107 mmol/L (ref 101–111)
GFR calc Af Amer: >60 mL/min (ref >60)
GLUCOSE: 75 mg/dL (ref 65–99)
Potassium: 4.1 mmol/L (ref 3.5–5.1)
SODIUM: 140 mmol/L (ref 135–145)

## 2015-02-19 LAB — PREGNANCY, URINE: PREG TEST UR: NEGATIVE

## 2015-02-19 SURGERY — LIGATION, FALLOPIAN TUBE, LAPAROSCOPIC
Anesthesia: General | Laterality: Bilateral

## 2015-02-19 MED ORDER — MIDAZOLAM HCL 2 MG/2ML IJ SOLN
INTRAMUSCULAR | Status: DC | PRN
  Administered 2015-02-19: 2 mg via INTRAVENOUS

## 2015-02-19 MED ORDER — KETOROLAC TROMETHAMINE 30 MG/ML IJ SOLN
INTRAMUSCULAR | Status: AC
  Filled 2015-02-19: qty 1

## 2015-02-19 MED ORDER — ROCURONIUM BROMIDE 100 MG/10ML IV SOLN
INTRAVENOUS | Status: AC
  Filled 2015-02-19: qty 1

## 2015-02-19 MED ORDER — MIDAZOLAM HCL 2 MG/2ML IJ SOLN
INTRAMUSCULAR | Status: AC
  Filled 2015-02-19: qty 4

## 2015-02-19 MED ORDER — HYDROCODONE-ACETAMINOPHEN 7.5-325 MG PO TABS: 1.0000 | ORAL_TABLET | Freq: Once | ORAL | Status: DC | PRN

## 2015-02-19 MED ORDER — KETOROLAC TROMETHAMINE 30 MG/ML IJ SOLN
30.0000 mg | Freq: Once | INTRAMUSCULAR | Status: AC
  Administered 2015-02-19: 30 mg via INTRAMUSCULAR

## 2015-02-19 MED ORDER — ONDANSETRON HCL 4 MG/2ML IJ SOLN
INTRAMUSCULAR | Status: DC | PRN
  Administered 2015-02-19: 4 mg via INTRAVENOUS

## 2015-02-19 MED ORDER — SODIUM CHLORIDE 0.9 % IJ SOLN
INTRAMUSCULAR | Status: AC
  Filled 2015-02-19: qty 10

## 2015-02-19 MED ORDER — DEXAMETHASONE SODIUM PHOSPHATE 4 MG/ML IJ SOLN
INTRAMUSCULAR | Status: AC
  Filled 2015-02-19: qty 1

## 2015-02-19 MED ORDER — GLYCOPYRROLATE 0.2 MG/ML IJ SOLN
INTRAMUSCULAR | Status: AC
  Filled 2015-02-19: qty 3

## 2015-02-19 MED ORDER — BUPIVACAINE HCL (PF) 0.25 % IJ SOLN
INTRAMUSCULAR | Status: AC
Start: 2015-02-19 — End: 2015-02-19
  Filled 2015-02-19: qty 30

## 2015-02-19 MED ORDER — FENTANYL CITRATE (PF) 100 MCG/2ML IJ SOLN: 25.0000 ug | INTRAMUSCULAR | Status: DC | PRN

## 2015-02-19 MED ORDER — HYDROMORPHONE HCL 1 MG/ML IJ SOLN
0.5000 mg | INTRAMUSCULAR | Status: AC | PRN
  Administered 2015-02-19 (×4): 0.25 mg via INTRAVENOUS

## 2015-02-19 MED ORDER — GLYCOPYRROLATE 0.2 MG/ML IJ SOLN
INTRAMUSCULAR | Status: DC | PRN
  Administered 2015-02-19: 0.4 mg via INTRAVENOUS

## 2015-02-19 MED ORDER — BUPIVACAINE HCL (PF) 0.25 % IJ SOLN
INTRAMUSCULAR | Status: DC | PRN
  Administered 2015-02-19: 8 mL

## 2015-02-19 MED ORDER — LIDOCAINE HCL (CARDIAC) 20 MG/ML IV SOLN
INTRAVENOUS | Status: AC
  Filled 2015-02-19: qty 5

## 2015-02-19 MED ORDER — ONDANSETRON HCL 4 MG/2ML IJ SOLN
INTRAMUSCULAR | Status: AC
  Filled 2015-02-19: qty 2

## 2015-02-19 MED ORDER — SCOPOLAMINE 1 MG/3DAYS TD PT72
MEDICATED_PATCH | TRANSDERMAL | Status: AC
  Administered 2015-02-19: 1.5 mg via TRANSDERMAL
  Filled 2015-02-19: qty 1

## 2015-02-19 MED ORDER — OXYCODONE-ACETAMINOPHEN 5-325 MG PO TABS: 1.0000 | ORAL_TABLET | ORAL | Status: DC | PRN

## 2015-02-19 MED ORDER — OXYCODONE-ACETAMINOPHEN 5-325 MG PO TABS
ORAL_TABLET | ORAL | Status: AC
  Administered 2015-02-19: 1 via ORAL
  Filled 2015-02-19: qty 1

## 2015-02-19 MED ORDER — FENTANYL CITRATE (PF) 100 MCG/2ML IJ SOLN
INTRAMUSCULAR | Status: AC
  Filled 2015-02-19: qty 2

## 2015-02-19 MED ORDER — OXYCODONE-ACETAMINOPHEN 5-325 MG PO TABS
1.0000 | ORAL_TABLET | ORAL | Status: DC | PRN
  Administered 2015-02-19: 1 via ORAL

## 2015-02-19 MED ORDER — PROPOFOL 10 MG/ML IV BOLUS
INTRAVENOUS | Status: DC | PRN
  Administered 2015-02-19: 200 mg via INTRAVENOUS

## 2015-02-19 MED ORDER — NEOSTIGMINE METHYLSULFATE 10 MG/10ML IV SOLN
INTRAVENOUS | Status: AC
  Filled 2015-02-19: qty 1

## 2015-02-19 MED ORDER — SODIUM CHLORIDE 0.9 % IJ SOLN
INTRAMUSCULAR | Status: DC | PRN
  Administered 2015-02-19: 10 mL

## 2015-02-19 MED ORDER — MEPERIDINE HCL 25 MG/ML IJ SOLN: 6.2500 mg | INTRAMUSCULAR | Status: DC | PRN

## 2015-02-19 MED ORDER — FENTANYL CITRATE (PF) 100 MCG/2ML IJ SOLN
INTRAMUSCULAR | Status: DC | PRN
  Administered 2015-02-19: 100 ug via INTRAVENOUS
  Administered 2015-02-19: 50 ug via INTRAVENOUS
  Administered 2015-02-19: 100 ug via INTRAVENOUS

## 2015-02-19 MED ORDER — KETOROLAC TROMETHAMINE 30 MG/ML IJ SOLN
INTRAMUSCULAR | Status: DC | PRN
  Administered 2015-02-19: 30 mg via INTRAVENOUS

## 2015-02-19 MED ORDER — LACTATED RINGERS IV SOLN
INTRAVENOUS | Status: DC
  Administered 2015-02-19 (×2): via INTRAVENOUS

## 2015-02-19 MED ORDER — PROPOFOL 10 MG/ML IV BOLUS
INTRAVENOUS | Status: AC
  Filled 2015-02-19: qty 20

## 2015-02-19 MED ORDER — NEOSTIGMINE METHYLSULFATE 10 MG/10ML IV SOLN
INTRAVENOUS | Status: DC | PRN
  Administered 2015-02-19: 3 mg via INTRAVENOUS

## 2015-02-19 MED ORDER — METOCLOPRAMIDE HCL 5 MG/ML IJ SOLN: 10.0000 mg | Freq: Once | INTRAMUSCULAR | Status: DC | PRN

## 2015-02-19 MED ORDER — SCOPOLAMINE 1 MG/3DAYS TD PT72
1.0000 | MEDICATED_PATCH | Freq: Once | TRANSDERMAL | Status: DC
  Administered 2015-02-19: 1.5 mg via TRANSDERMAL

## 2015-02-19 MED ORDER — FENTANYL CITRATE (PF) 250 MCG/5ML IJ SOLN
INTRAMUSCULAR | Status: AC
  Filled 2015-02-19: qty 25

## 2015-02-19 MED ORDER — LIDOCAINE HCL (CARDIAC) 20 MG/ML IV SOLN
INTRAVENOUS | Status: DC | PRN
  Administered 2015-02-19: 100 mg via INTRAVENOUS

## 2015-02-19 MED ORDER — HYDROMORPHONE HCL 1 MG/ML IJ SOLN
INTRAMUSCULAR | Status: AC
  Administered 2015-02-19: 0.25 mg via INTRAVENOUS
  Filled 2015-02-19: qty 1

## 2015-02-19 MED ORDER — ROCURONIUM BROMIDE 100 MG/10ML IV SOLN
INTRAVENOUS | Status: DC | PRN
  Administered 2015-02-19: 30 mg via INTRAVENOUS

## 2015-02-19 MED ORDER — DEXAMETHASONE SODIUM PHOSPHATE 10 MG/ML IJ SOLN
INTRAMUSCULAR | Status: DC | PRN
  Administered 2015-02-19: 4 mg via INTRAVENOUS

## 2015-02-19 SURGICAL SUPPLY — 22 items
CATH ROBINSON RED A/P 16FR (CATHETERS) ×2 IMPLANT
CHLORAPREP W/TINT 26ML (MISCELLANEOUS) ×2 IMPLANT
CLOTH BEACON ORANGE TIMEOUT ST (SAFETY) ×2 IMPLANT
DRSG COVADERM PLUS 2X2 (GAUZE/BANDAGES/DRESSINGS) ×4 IMPLANT
DRSG OPSITE POSTOP 3X4 (GAUZE/BANDAGES/DRESSINGS) ×2 IMPLANT
GLOVE BIO SURGEON STRL SZ7 (GLOVE) ×6 IMPLANT
GLOVE BIO SURGEON STRL SZ8 (GLOVE) ×2 IMPLANT
GLOVE ORTHO TXT STRL SZ7.5 (GLOVE) ×2 IMPLANT
GOWN STRL REUS W/TWL LRG LVL3 (GOWN DISPOSABLE) ×4 IMPLANT
LIQUID BAND (GAUZE/BANDAGES/DRESSINGS) ×2 IMPLANT
NEEDLE INSUFFLATION 120MM (ENDOMECHANICALS) ×2 IMPLANT
PACK LAPAROSCOPY BASIN (CUSTOM PROCEDURE TRAY) ×2 IMPLANT
PAD POSITIONING PINK XL (MISCELLANEOUS) ×2 IMPLANT
SUT VIC AB 3-0 CTX 36 (SUTURE) IMPLANT
SUT VIC AB 3-0 PS2 18 (SUTURE) ×1
SUT VIC AB 3-0 PS2 18XBRD (SUTURE) ×1 IMPLANT
SUT VICRYL 0 UR6 27IN ABS (SUTURE) ×2 IMPLANT
TOWEL OR 17X24 6PK STRL BLUE (TOWEL DISPOSABLE) ×4 IMPLANT
TROCAR XCEL NON-BLD 11X100MML (ENDOMECHANICALS) ×2 IMPLANT
TROCAR XCEL NON-BLD 5MMX100MML (ENDOMECHANICALS) ×2 IMPLANT
WARMER LAPAROSCOPE (MISCELLANEOUS) ×2 IMPLANT
WATER STERILE IRR 1000ML POUR (IV SOLUTION) ×2 IMPLANT

## 2015-02-19 NOTE — Transfer of Care (Signed)
Immediate Anesthesia Transfer of Care Note  Patient: Tammy Robinson  Procedure(s) Performed: Procedure(s): LAPAROSCOPIC TUBAL LIGATION (Bilateral)  Patient Location: PACU  Anesthesia Type:General  Level of Consciousness: awake, alert  and oriented  Airway & Oxygen Therapy: Patient Spontanous Breathing and Patient connected to nasal cannula oxygen  Post-op Assessment: Report given to RN, Post -op Vital signs reviewed and stable and Patient moving all extremities  Post vital signs: Reviewed and stable  Last Vitals:  Filed Vitals:   02/19/15 0614  BP: 136/99  Pulse: 95  Temp: 36.8 C  Resp: 18    Complications: No apparent anesthesia complications

## 2015-02-19 NOTE — Discharge Instructions (Signed)
DISCHARGE INSTRUCTIONS: Laparoscopy  The following instructions have been prepared to help you care for yourself upon your return home today.  May remove Scop patch on or before 02/21/2015.  May take Ibuprofen after 3:00 pm today as needed for pain.  Wound care:  Do not get the incision wet for the first 24 hours. The incision should be kept clean and dry.  The Band-Aids or dressings may be removed the day after surgery.  Should the incision become sore, red, and swollen after the first week, check with your doctor.  Personal hygiene:  Shower the day after your procedure.  Activity and limitations:  Do NOT drive or operate any equipment today.  Do NOT lift anything more than 15 pounds for 2-3 weeks after surgery.  Do NOT rest in bed all day.  Walking is encouraged. Walk each day, starting slowly with 5-minute walks 3 or 4 times a day. Slowly increase the length of your walks.  Walk up and down stairs slowly.  Do NOT do strenuous activities, such as golfing, playing tennis, bowling, running, biking, weight lifting, gardening, mowing, or vacuuming for 2-4 weeks. Ask your doctor when it is okay to start.  Diet: Eat a light meal as desired this evening. You may resume your usual diet tomorrow.  Return to work: This is dependent on the type of work you do. For the most part you can return to a desk job within a week of surgery. If you are more active at work, please discuss this with your doctor.  What to expect after your surgery: You may have a slight burning sensation when you urinate on the first day. You may have a very small amount of blood in the urine. Expect to have a small amount of vaginal discharge/light bleeding for 1-2 weeks. It is not unusual to have abdominal soreness and bruising for up to 2 weeks. You may be tired and need more rest for about 1 week. You may experience shoulder pain for 24-72 hours. Lying flat in bed may relieve it.  Call your doctor for any of the  following:  Develop a fever of 100.4 or greater  Inability to urinate 6 hours after discharge from hospital  Severe pain not relieved by pain medications  Persistent of heavy bleeding at incision site  Redness or swelling around incision site after a week  Increasing nausea or vomiting

## 2015-02-19 NOTE — Interval H&P Note (Signed)
History and Physical Interval Note:  02/19/2015 7:11 AM  Tammy Robinson  has presented today for surgery, with the diagnosis of Sterilization  The various methods of treatment have been discussed with the patient and family. After consideration of risks, benefits and other options for treatment, the patient has consented to  Procedure(s): LAPAROSCOPIC TUBAL LIGATION (Bilateral) as a surgical intervention .  The patient's history has been reviewed, patient examined, no change in status, stable for surgery.  I have reviewed the patient's chart and labs.  Questions were answered to the patient's satisfaction.     MEISINGER,TODD D

## 2015-02-19 NOTE — Anesthesia Postprocedure Evaluation (Signed)
  Anesthesia Post-op Note  Patient: Tammy Robinson  Procedure(s) Performed: Procedure(s): LAPAROSCOPIC TUBAL LIGATION (Bilateral)  Patient Location: PACU  Anesthesia Type:General  Level of Consciousness: awake, alert  and oriented  Airway and Oxygen Therapy: Patient Spontanous Breathing  Post-op Pain: none  Post-op Assessment: Post-op Vital signs reviewed, Patient's Cardiovascular Status Stable, Respiratory Function Stable, Patent Airway, No signs of Nausea or vomiting and Pain level controlled              Post-op Vital Signs: Reviewed and stable  Last Vitals:  Filed Vitals:   02/19/15 0915  BP: 132/81  Pulse: 89  Temp: 37 C  Resp: 20    Complications: No apparent anesthesia complications

## 2015-02-19 NOTE — Anesthesia Procedure Notes (Signed)
Procedure Name: Intubation Date/Time: 02/19/2015 7:24 AM Performed by: Elgie Congo Pre-anesthesia Checklist: Patient identified, Patient being monitored, Emergency Drugs available and Suction available Patient Re-evaluated:Patient Re-evaluated prior to inductionOxygen Delivery Method: Circle system utilized Preoxygenation: Pre-oxygenation with 100% oxygen Intubation Type: IV induction Ventilation: Mask ventilation without difficulty Laryngoscope Size: Mac and 3 Grade View: Grade I Number of attempts: 1 Airway Equipment and Method: Stylet Placement Confirmation: ETT inserted through vocal cords under direct vision,  positive ETCO2 and breath sounds checked- equal and bilateral Secured at: 21 cm Tube secured with: Tape Dental Injury: Teeth and Oropharynx as per pre-operative assessment

## 2015-02-19 NOTE — Op Note (Signed)
Preoperative diagnosis: Desires surgical sterility Postoperative diagnosis: Same Procedure: Laparoscopic bilateral tubal fulguration Surgeon: Lavina Hamman M.D. Anesthesia: Gen. Endotracheal tube Findings: She had a normal abdomen, normal uterus tubes and ovaries with 3 cm simple cyst on right ovary that drained clear fluid.  Possible small implants of endometriosis in posterior culdesac Specimens: None Estimated blood loss: Minimal Complications: None  Procedure in detail  The patient was taken to the operating room and placed in the dorsosupine position. General anesthesia was induced. Her legs were placed in mobile stirrups and her left arm was tucked to her side. Abdomen perineum and vagina were then prepped and draped in the usual sterile fashion, bladder drained with a red robinson catheter, a Hulka tenaculum was applied to the cervix for uterine manipulation. Infraumbilical skin was then infiltrated with quarter percent Marcaine and a 1.5 cm vertical incision was made. The veress needle was inserted into the peritoneal cavity and placement confirmed by the water drop test and an opening pressure of 3 mm of mercury. CO2 was insufflated to a pressure of 12 mm of mercury and the veress needle was removed. A 10/11 disposable trocar was then introduced with direct visualization with the laparoscope. Inspection revealed the above-mentioned findings with normal anatomy. Each fallopian tube is identified and traced to it's fimbriated end.  3 segments of the middle portion of each tube is fulgurated with Kleppingers. This achieved good fulguration.  This is done bilaterally without difficulty. A segment of the ovarian cyst on the right side was fulgurated and the cyst ruptured and drained clear fluid.  All gas was allowed to deflate from the abdomen and the umbilical trocar was removed. One figure-of-eight suture of 0 Vicryl was placed in the umbilical incision. Skin incision was then closed with  interrupted subcuticular sutures of 4-0 Vicryl followed by Liquiband. The Hulka tenaculum was removed. The patient was taken down from stirrups. She was awakened in the operating room and taken to the recovery room in stable condition after tolerating the procedure well. Counts were correct and she had PAS hose on throughout the procedure.

## 2015-02-20 ENCOUNTER — Encounter (HOSPITAL_COMMUNITY): Payer: Self-pay | Admitting: Obstetrics and Gynecology

## 2015-08-19 ENCOUNTER — Emergency Department (HOSPITAL_COMMUNITY): Payer: Medicaid Other

## 2015-08-19 ENCOUNTER — Emergency Department (HOSPITAL_COMMUNITY)
Admission: EM | Admit: 2015-08-19 | Discharge: 2015-08-19 | Disposition: A | Discharge status: Home / Self Care | Payer: Medicaid Other | Attending: Emergency Medicine | Admitting: Emergency Medicine

## 2015-08-19 ENCOUNTER — Encounter (HOSPITAL_COMMUNITY): Payer: Self-pay

## 2015-08-19 DIAGNOSIS — Z8632 Personal history of gestational diabetes: Secondary | ICD-10-CM | POA: Insufficient documentation

## 2015-08-19 DIAGNOSIS — R112 Nausea with vomiting, unspecified: Secondary | ICD-10-CM | POA: Insufficient documentation

## 2015-08-19 DIAGNOSIS — R197 Diarrhea, unspecified: Secondary | ICD-10-CM | POA: Diagnosis not present

## 2015-08-19 DIAGNOSIS — Z87440 Personal history of urinary (tract) infections: Secondary | ICD-10-CM | POA: Diagnosis not present

## 2015-08-19 DIAGNOSIS — K029 Dental caries, unspecified: Secondary | ICD-10-CM | POA: Diagnosis not present

## 2015-08-19 DIAGNOSIS — K0381 Cracked tooth: Secondary | ICD-10-CM | POA: Insufficient documentation

## 2015-08-19 DIAGNOSIS — Z8659 Personal history of other mental and behavioral disorders: Secondary | ICD-10-CM | POA: Diagnosis not present

## 2015-08-19 DIAGNOSIS — Z79899 Other long term (current) drug therapy: Secondary | ICD-10-CM | POA: Insufficient documentation

## 2015-08-19 DIAGNOSIS — K0889 Other specified disorders of teeth and supporting structures: Secondary | ICD-10-CM | POA: Diagnosis not present

## 2015-08-19 DIAGNOSIS — F1310 Sedative, hypnotic or anxiolytic abuse, uncomplicated: Secondary | ICD-10-CM | POA: Insufficient documentation

## 2015-08-19 DIAGNOSIS — Z880 Allergy status to penicillin: Secondary | ICD-10-CM | POA: Diagnosis not present

## 2015-08-19 DIAGNOSIS — I10 Essential (primary) hypertension: Secondary | ICD-10-CM | POA: Diagnosis not present

## 2015-08-19 DIAGNOSIS — K002 Abnormalities of size and form of teeth: Secondary | ICD-10-CM | POA: Diagnosis not present

## 2015-08-19 DIAGNOSIS — Z8742 Personal history of other diseases of the female genital tract: Secondary | ICD-10-CM | POA: Insufficient documentation

## 2015-08-19 DIAGNOSIS — F1210 Cannabis abuse, uncomplicated: Secondary | ICD-10-CM | POA: Diagnosis not present

## 2015-08-19 DIAGNOSIS — R42 Dizziness and giddiness: Secondary | ICD-10-CM | POA: Insufficient documentation

## 2015-08-19 DIAGNOSIS — Z87891 Personal history of nicotine dependence: Secondary | ICD-10-CM | POA: Diagnosis not present

## 2015-08-19 LAB — I-STAT TROPONIN, ED: Troponin i, poc: 0 ng/mL (ref 0.00–0.08)

## 2015-08-19 LAB — CBC WITH DIFFERENTIAL/PLATELET
Basophils Absolute: 0 10*3/uL (ref 0.0–0.1)
Basophils Relative: 0 %
Eosinophils Absolute: 0.2 10*3/uL (ref 0.0–0.7)
Eosinophils Relative: 2 %
HCT: 42.6 % (ref 36.0–46.0)
Hemoglobin: 14.4 g/dL (ref 12.0–15.0)
Lymphocytes Relative: 20 %
Lymphs Abs: 2.2 10*3/uL (ref 0.7–4.0)
MCH: 31.4 pg (ref 26.0–34.0)
MCHC: 33.8 g/dL (ref 30.0–36.0)
MCV: 93 fL (ref 78.0–100.0)
Monocytes Absolute: 0.6 10*3/uL (ref 0.1–1.0)
Monocytes Relative: 5 %
Neutro Abs: 7.6 10*3/uL (ref 1.7–7.7)
Neutrophils Relative %: 73 %
Platelets: 309 10*3/uL (ref 150–400)
RBC: 4.58 MIL/uL (ref 3.87–5.11)
RDW: 14.1 % (ref 11.5–15.5)
WBC: 10.6 10*3/uL — ABNORMAL HIGH (ref 4.0–10.5)

## 2015-08-19 LAB — COMPREHENSIVE METABOLIC PANEL
ALT: 24 U/L (ref 14–54)
AST: 20 U/L (ref 15–41)
Albumin: 3.8 g/dL (ref 3.5–5.0)
Alkaline Phosphatase: 53 U/L (ref 38–126)
Anion gap: 7 (ref 5–15)
BUN: 13 mg/dL (ref 6–20)
CO2: 26 mmol/L (ref 22–32)
Calcium: 9 mg/dL (ref 8.9–10.3)
Chloride: 111 mmol/L (ref 101–111)
Creatinine, Ser: 0.8 mg/dL (ref 0.44–1.00)
GFR calc Af Amer: >60 mL/min (ref >60)
GFR calc non Af Amer: >60 mL/min (ref >60)
Glucose, Bld: 84 mg/dL (ref 65–99)
Potassium: 3.9 mmol/L (ref 3.5–5.1)
Sodium: 144 mmol/L (ref 135–145)
Total Bilirubin: 0.5 mg/dL (ref 0.3–1.2)
Total Protein: 6.4 g/dL — ABNORMAL LOW (ref 6.5–8.1)

## 2015-08-19 LAB — RAPID URINE DRUG SCREEN, HOSP PERFORMED
Amphetamines: NOT DETECTED
Barbiturates: NOT DETECTED
Benzodiazepines: POSITIVE — AB
Cocaine: NOT DETECTED
Opiates: NOT DETECTED
Tetrahydrocannabinol: POSITIVE — AB

## 2015-08-19 LAB — ETHANOL: Alcohol, Ethyl (B): <5 mg/dL (ref <5)

## 2015-08-19 LAB — CBG MONITORING, ED: Glucose-Capillary: 136 mg/dL — ABNORMAL HIGH (ref 65–99)

## 2015-08-19 MED ORDER — SODIUM CHLORIDE 0.9 % IV BOLUS (SEPSIS)
1000.0000 mL | Freq: Once | INTRAVENOUS | Status: AC
  Administered 2015-08-19: 1000 mL via INTRAVENOUS

## 2015-08-19 MED ORDER — MECLIZINE HCL 25 MG PO TABS
25.0000 mg | ORAL_TABLET | Freq: Once | ORAL | Status: AC
  Administered 2015-08-19: 25 mg via ORAL
  Filled 2015-08-19: qty 1

## 2015-08-19 MED ORDER — BUPIVACAINE-EPINEPHRINE (PF) 0.5% -1:200000 IJ SOLN
1.8000 mL | Freq: Once | INTRAMUSCULAR | Status: AC
  Administered 2015-08-19: 1.8 mL
  Filled 2015-08-19: qty 1.8

## 2015-08-19 MED ORDER — ONDANSETRON 4 MG PO TBDP
4.0000 mg | ORAL_TABLET | Freq: Once | ORAL | Status: DC
  Filled 2015-08-19: qty 1

## 2015-08-19 MED ORDER — MECLIZINE HCL 12.5 MG PO TABS: 12.5000 mg | ORAL_TABLET | Freq: Three times a day (TID) | ORAL | Status: DC | PRN

## 2015-08-19 MED ORDER — CLINDAMYCIN HCL 150 MG PO CAPS: 450.0000 mg | ORAL_CAPSULE | Freq: Three times a day (TID) | ORAL | Status: AC
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
## 2015-08-19 NOTE — ED Notes (Signed)
She c/o intermittent "dizziness" x 1 week.  She also c/o left-sided toothache (three bottom teeth).  She is in no distress.

## 2015-08-19 NOTE — ED Notes (Signed)
PA at bedside.

## 2015-08-19 NOTE — Discharge Instructions (Signed)
Instructions  Please take clindamycin as prescribed for your dental infection. You may take ibuprofen every 4-6 hours as needed for the pain. Please follow up with one of the dental resources outlined below as soon as possible for extraction of your infected teeth. You may take Antivert up to 3 times daily as needed for dizziness. Please follow up with you primary care doctor this week for further evaluation and treatment of your dizziness. Please return to the emergency department if your dizziness worsens or becomes more frequent, you develop any numbness, weakness, changes in mental status or speech, or any other new or concerning symptom.   [employer] [facility] [employer] 211 is a great source of information about community services available.  Access by dialing 2-1-1 from anywhere in [place], or by website -  PooledIncome.pl.   Other Local Resources (Updated 05/2015)  Dental  Care   Services    Phone Number and Address  Cost  [facility] For children 12 - 60 years of age:   Cleaning  Tooth brushing/flossing instruction  Sealants, fillings, crowns  Extractions  Emergency treatment  [phone] [address] [place], [place] [address] Charges based on family income.  Medicaid and some insurance plans accepted.     [facility] - [facility], fillings, crowns  Extractions  Emergency treatment [phone] [place], [place]  Pregnant women 89 years of age or older with a Medicaid card  [facility] - [place]  Cleaning  Sealants, fillings, crowns  Extractions  Emergency treatment [phone] [address] [place], [place] Pregnant women 44 years of age or older with a Medicaid card  [facility] - [facility] For children 69 - 63 years of age:   Cleaning  Tooth  brushing/flossing instruction  Sealants, fillings, crowns  Extractions  Emergency treatment Limited orthodontic services for patients with Medicaid [phone] [address] [place], [place] [address] Medicaid and [facility] Health Choice cover for children up to age 51 and pregnant women.  Parents of children up to age 29 without Medicaid pay a reduced fee at time of service.  [facility] of [employer] For children 36 - 39 years of age:   Cleaning  Tooth brushing/flossing instruction  Sealants, fillings, crowns  Extractions  Emergency treatment Limited orthodontic services for patients with Medicaid [phone] [address] [place], [place].  Medicaid and [place] Health Choice cover for children up to age 83 and pregnant women.  Parents of children up to age 11 without Medicaid pay a reduced fee.  [facility]  Sealants, fillings, crowns  Extractions  Hours: Tuesdays and Thursdays, 4:15 - 8 pm [phone] [address] [place], [place] [address] Services free of charge to [facility] residents ages 18-64 who do not have health insurance, Medicare, [place], or [place] benefits and fall within federal poverty guidelines  [employer]    Provides dental care in addition to primary medical care, nutritional counseling, and pharmacy:  Nurse, mental health, fillings, crowns  Extractions                  [phone] [facility], [address] [place], [place]  [phone] [facility], [address] [place], [place]  [phone] [facility]  [place], [place]  [phone] [facility], [address] [place], [place]  [phone] [facility] [address] [place], [place] Accepts [place], [place], most insurance.  Also provides services  available to all with fees adjusted based on ability to pay.    [facility]  Cleaning  Tooth brushing/flossing instruction  Sealants, fillings, crowns  Extractions  Emergency treatment Hours: Tuesdays, Thursdays, and Fridays from 8 am to 5 pm by appointment only. [phone] [address] [place], [place] [phone] [facility] residents with Medicaid (depending on eligibility) and children with [facility] Health Choice - call for more information.  [facility]  Extractions only  Hours: 2nd and 4th Thursday of each month from 6:30 am - 9 am.   [phone] [address] [place], [place] [id number] Ages 6218 and older only.  Patients are seen on a first come, first served basis.  [employer]  [employer]  Fillings  Extractions  Orthodontics  Endodontics  Implants/Crowns/Bridges  Complete and partial dentures [phone] [place], [place] Patients must complete an application for services.  There is often a waiting list.     Dizziness Dizziness is a common problem. It is a feeling of unsteadiness or light-headedness. You may feel like you are about to faint. Dizziness can lead to injury if you stumble or fall. Anyone can become dizzy, but dizziness is more common in older adults. This condition can be caused by a number of things, including medicines, dehydration, or illness. HOME CARE INSTRUCTIONS Taking these steps may help with your condition: Eating and Drinking  Drink enough fluid to keep your urine clear or pale yellow. This helps to keep you from becoming dehydrated. Try to drink more clear fluids, such as water.  Do not drink alcohol.  Limit your caffeine intake if directed by your health care provider.  Limit your salt intake if directed by your health care provider. Activity  Avoid making quick movements.  Rise slowly from chairs and steady yourself until you feel okay.  In the morning, first sit up  on the side of the bed. When you feel okay, stand slowly while you hold onto something until you know that your balance is fine.  Move your legs often if you need to stand in one place for a long time. Tighten and relax your muscles in your legs while you are standing.  Do not drive or operate heavy machinery if you feel dizzy.  Avoid bending down if you feel dizzy. Place items in your home so that they are easy for you to reach without leaning over. Lifestyle  Do not use any tobacco products, including cigarettes, chewing tobacco, or electronic cigarettes. If you need help quitting, ask your health care provider.  Try to reduce your stress level, such as with yoga or meditation. Talk with your health care provider if you need help. General Instructions  Watch your dizziness for any changes.  Take medicines only as directed by your health care provider. Talk with your health care provider if you think that your dizziness is caused by a medicine that you are taking.  Tell a friend or a family member that you are feeling dizzy. If he or she notices any changes in your behavior, have this person call your health care provider.  Keep all follow-up visits as directed by your health care provider. This is important. SEEK MEDICAL CARE IF:  Your dizziness does not go away.  Your dizziness or light-headedness gets worse.  You feel nauseous.  You have reduced hearing.  You have new symptoms.  You are unsteady on your feet or you feel like the room is spinning. SEEK IMMEDIATE MEDICAL CARE IF:  You vomit or have diarrhea and are unable to eat or drink anything.  You have problems talking, walking, swallowing, or using your arms, hands, or legs.  You feel generally weak.  You are not thinking clearly or you have trouble forming sentences. It may take a friend or family member to notice this.  You have chest pain, abdominal pain, shortness of breath, or sweating.  Your vision  changes.  You notice any bleeding.  You have a headache.  You have neck pain or a stiff neck.  You have a fever.   This information is not intended to replace advice given to you by your health care provider. Make sure you discuss any questions you have with your health care provider.   Document Released: 11/05/2000 Document Revised: 09/26/2014 Document Reviewed: 05/08/2014 Elsevier Interactive Patient Education [employer]

## 2015-08-19 NOTE — ED Notes (Signed)
Pt ambulated to bathroom with assistance.

## 2015-08-19 NOTE — ED Notes (Signed)
Pt informed that because of her drowsiness and with the medications in her system, it is unsafe for her to drive herself home
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
## 2016-07-15 ENCOUNTER — Other Ambulatory Visit: Payer: Self-pay | Admitting: Family Medicine

## 2016-07-15 ENCOUNTER — Ambulatory Visit
Admission: RE | Admit: 2016-07-15 | Discharge: 2016-07-15 | Disposition: A | Discharge status: Home / Self Care | Payer: Worker's Compensation | Source: Ambulatory Visit | Attending: Family Medicine | Admitting: Family Medicine

## 2016-07-15 DIAGNOSIS — R609 Edema, unspecified: Secondary | ICD-10-CM

## 2016-07-15 DIAGNOSIS — R52 Pain, unspecified: Secondary | ICD-10-CM

## 2016-07-16 ENCOUNTER — Ambulatory Visit (INDEPENDENT_AMBULATORY_CARE_PROVIDER_SITE_OTHER): Payer: Self-pay | Admitting: Orthopaedic Surgery

## 2016-07-17 ENCOUNTER — Encounter (INDEPENDENT_AMBULATORY_CARE_PROVIDER_SITE_OTHER): Payer: Self-pay | Admitting: Orthopedic Surgery

## 2016-07-17 ENCOUNTER — Ambulatory Visit (INDEPENDENT_AMBULATORY_CARE_PROVIDER_SITE_OTHER): Payer: Worker's Compensation | Admitting: Orthopedic Surgery

## 2016-07-17 DIAGNOSIS — S62646A Nondisplaced fracture of proximal phalanx of right little finger, initial encounter for closed fracture: Secondary | ICD-10-CM

## 2016-07-17 MED ORDER — OXYCODONE HCL 5 MG PO TABS: 5.0000 mg | ORAL_TABLET | Freq: Two times a day (BID) | ORAL | 0 refills | Status: DC | PRN

## 2016-07-20 NOTE — Progress Notes (Signed)
Tammy Robinson
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
## 2016-12-18 ENCOUNTER — Encounter (HOSPITAL_COMMUNITY): Payer: Self-pay

## 2016-12-18 ENCOUNTER — Emergency Department (HOSPITAL_COMMUNITY)
Admission: EM | Admit: 2016-12-18 | Discharge: 2016-12-18 | Discharge status: Against Medical Advice | Payer: Medicaid Other | Attending: Emergency Medicine | Admitting: Emergency Medicine

## 2016-12-18 DIAGNOSIS — H5711 Ocular pain, right eye: Secondary | ICD-10-CM | POA: Insufficient documentation

## 2016-12-18 DIAGNOSIS — Z5321 Procedure and treatment not carried out due to patient leaving prior to being seen by health care provider: Secondary | ICD-10-CM | POA: Insufficient documentation
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
## 2016-12-18 NOTE — ED Notes (Signed)
Bed: [facility] Expected date:  Expected time:  Means of arrival:  Comments: 

## 2016-12-18 NOTE — ED Notes (Signed)
Pt's significant other was discharged and they left the ED

## 2017-07-02 ENCOUNTER — Other Ambulatory Visit: Payer: Self-pay

## 2017-07-02 ENCOUNTER — Emergency Department (HOSPITAL_COMMUNITY): Payer: Self-pay

## 2017-07-02 ENCOUNTER — Emergency Department (HOSPITAL_COMMUNITY): Admission: EM | Admit: 2017-07-02 | Discharge: 2017-07-02 | Discharge status: Against Medical Advice | Payer: Self-pay

## 2017-07-02 ENCOUNTER — Encounter (HOSPITAL_COMMUNITY): Payer: Self-pay | Admitting: Emergency Medicine

## 2017-07-02 DIAGNOSIS — Z5321 Procedure and treatment not carried out due to patient leaving prior to being seen by health care provider: Secondary | ICD-10-CM | POA: Insufficient documentation

## 2017-07-02 DIAGNOSIS — R42 Dizziness and giddiness: Secondary | ICD-10-CM | POA: Insufficient documentation

## 2017-07-02 LAB — BASIC METABOLIC PANEL
Anion gap: 5 (ref 5–15)
BUN: 12 mg/dL (ref 6–20)
CALCIUM: 8.9 mg/dL (ref 8.9–10.3)
CHLORIDE: 105 mmol/L (ref 101–111)
CO2: 28 mmol/L (ref 22–32)
CREATININE: 0.79 mg/dL (ref 0.44–1.00)
GFR calc Af Amer: >60 mL/min (ref >60)
GFR calc non Af Amer: >60 mL/min (ref >60)
GLUCOSE: 85 mg/dL (ref 65–99)
Potassium: 3.8 mmol/L (ref 3.5–5.1)
Sodium: 138 mmol/L (ref 135–145)

## 2017-07-02 LAB — CBC
HEMATOCRIT: 41.4 % (ref 36.0–46.0)
HEMOGLOBIN: 14 g/dL (ref 12.0–15.0)
MCH: 32 pg (ref 26.0–34.0)
MCHC: 33.8 g/dL (ref 30.0–36.0)
MCV: 94.7 fL (ref 78.0–100.0)
Platelets: 323 10*3/uL (ref 150–400)
RBC: 4.37 MIL/uL (ref 3.87–5.11)
RDW: 13.2 % (ref 11.5–15.5)
WBC: 10 10*3/uL (ref 4.0–10.5)

## 2017-07-02 NOTE — ED Triage Notes (Signed)
Pt states she was at work today and got dizzy and had to sit down  Pt states she returned to work and it kept happening  Pt states she had eaten and was drinking plenty of fluids  Pt also states she shut her right thumb in the car door  Pt has pain and swelling

## 2017-07-02 NOTE — ED Notes (Signed)
No response when called to triage from lobby. 

## 2017-07-02 NOTE — ED Notes (Signed)
No answer when called for triage 

## 2017-07-02 NOTE — ED Notes (Signed)
Pt called from the lobby the lobby with no response x3

## 2017-07-03 ENCOUNTER — Emergency Department (HOSPITAL_COMMUNITY)
Admission: EM | Admit: 2017-07-03 | Discharge: 2017-07-04 | Disposition: A | Discharge status: Home / Self Care | Payer: Self-pay | Attending: Emergency Medicine | Admitting: Emergency Medicine

## 2017-07-03 ENCOUNTER — Other Ambulatory Visit: Payer: Self-pay

## 2017-07-03 ENCOUNTER — Encounter (HOSPITAL_COMMUNITY): Payer: Self-pay | Admitting: Emergency Medicine

## 2017-07-03 ENCOUNTER — Emergency Department (HOSPITAL_COMMUNITY)
Admission: EM | Admit: 2017-07-03 | Discharge: 2017-07-03 | Disposition: A | Discharge status: Home / Self Care | Payer: Self-pay | Attending: Emergency Medicine | Admitting: Emergency Medicine

## 2017-07-03 DIAGNOSIS — Z79899 Other long term (current) drug therapy: Secondary | ICD-10-CM | POA: Insufficient documentation

## 2017-07-03 DIAGNOSIS — F419 Anxiety disorder, unspecified: Secondary | ICD-10-CM | POA: Insufficient documentation

## 2017-07-03 DIAGNOSIS — I10 Essential (primary) hypertension: Secondary | ICD-10-CM | POA: Insufficient documentation

## 2017-07-03 DIAGNOSIS — F17210 Nicotine dependence, cigarettes, uncomplicated: Secondary | ICD-10-CM | POA: Insufficient documentation

## 2017-07-03 DIAGNOSIS — Z9049 Acquired absence of other specified parts of digestive tract: Secondary | ICD-10-CM | POA: Insufficient documentation

## 2017-07-03 DIAGNOSIS — M79644 Pain in right finger(s): Secondary | ICD-10-CM | POA: Insufficient documentation

## 2017-07-03 MED ORDER — IBUPROFEN 600 MG PO TABS: 600.0000 mg | ORAL_TABLET | Freq: Four times a day (QID) | ORAL | 0 refills | Status: AC | PRN
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
## 2017-07-03 NOTE — ED Notes (Addendum)
Pt called from the lobby with no response x2 

## 2017-07-03 NOTE — ED Notes (Addendum)
Pt reports having woken up this morning with her R eye "swollen shut and draining". States she took tylenol and benadryl. Mild edema noted to R eye. Denies vision changes.   Pt given ice pack for thumb.

## 2017-07-03 NOTE — ED Triage Notes (Addendum)
Reports shutting right thumb in car door yesterday. [facility] was [facility] to [facility].  Reports that xrays were done.  Here due to continued pain.  Also c/o right eye being swollen and burning.

## 2017-07-03 NOTE — ED Notes (Signed)
Ortho tech paged for splint.

## 2017-07-03 NOTE — ED Notes (Signed)
Pt back inside. Asked pt to stay inside till she is seen

## 2017-07-03 NOTE — Discharge Instructions (Signed)
Please read and follow all provided instructions.  You have been seen today for right thumb pain  Tests performed today include: An x-ray of the affected area - does NOT show any broken bones or dislocations.  Vital signs. See below for your results today.   Home care instructions: -- *PRICE in the first 24-48 hours after injury: Protect (with brace, splint, sling), if given by your provider Rest Ice- Do not apply ice pack directly to your skin, place towel or similar between your skin and ice/ice pack. Apply ice for 20 min, then remove for 40 min while awake Compression- Wear brace, elastic bandage, splint as directed by your provider Elevate affected extremity above the level of your heart when not walking around for the first 24-48 hours   Use Ibuprofen (Motrin/Advil) 600mg  every 6 hours as needed for pain (do not exceed max dose in 24 hours, 2400mg )  Follow-up instructions: Please follow-up with your primary care provider or the provided orthopedic physician (bone specialist) if you continue to have significant pain in 1 week. In this case you may have a more severe injury that requires further care.   Return instructions:  Please return if your toes or feet are numb or tingling, appear gray or blue, or you have severe pain (also elevate the leg and loosen splint or wrap if you were given one) Please return to the Emergency Department if you experience worsening symptoms.  Please return if you have any other emergent concerns. Additional Information:  Your vital signs today were: BP 131/81 (BP Location: Left Arm)    Pulse 77    Temp 98.2 F (36.8 C) (Oral)    Resp 18    Ht 5\' 3"  (1.6 m)    Wt 59 kg (130 lb)    LMP 06/21/2017 (Approximate)    SpO2 98%    BMI 23.03 kg/m  If your blood pressure (BP) was elevated above 135/85 this visit, please have this repeated by your doctor within one month. ---------------

## 2017-07-03 NOTE — ED Notes (Addendum)
Pt seen going outside

## 2017-07-03 NOTE — ED Notes (Signed)
Pt called from the lobby with no response 

## 2017-07-03 NOTE — ED Notes (Signed)
ED Provider at bedside. 

## 2017-07-06 ENCOUNTER — Encounter (HOSPITAL_COMMUNITY): Payer: Self-pay | Admitting: Family Medicine

## 2017-07-06 ENCOUNTER — Emergency Department (HOSPITAL_COMMUNITY)
Admission: EM | Admit: 2017-07-06 | Discharge: 2017-07-06 | Disposition: A | Discharge status: Home / Self Care | Payer: Self-pay | Attending: Emergency Medicine | Admitting: Emergency Medicine

## 2017-07-06 DIAGNOSIS — Z79899 Other long term (current) drug therapy: Secondary | ICD-10-CM | POA: Insufficient documentation

## 2017-07-06 DIAGNOSIS — I10 Essential (primary) hypertension: Secondary | ICD-10-CM | POA: Insufficient documentation

## 2017-07-06 DIAGNOSIS — F17210 Nicotine dependence, cigarettes, uncomplicated: Secondary | ICD-10-CM | POA: Insufficient documentation

## 2017-07-06 DIAGNOSIS — R112 Nausea with vomiting, unspecified: Secondary | ICD-10-CM | POA: Insufficient documentation

## 2017-07-06 LAB — URINALYSIS, ROUTINE W REFLEX MICROSCOPIC
BILIRUBIN URINE: NEGATIVE
GLUCOSE, UA: NEGATIVE mg/dL
HGB URINE DIPSTICK: NEGATIVE
KETONES UR: NEGATIVE mg/dL
LEUKOCYTES UA: NEGATIVE
Nitrite: NEGATIVE
PH: 6 (ref 5.0–8.0)
Protein, ur: NEGATIVE mg/dL
Specific Gravity, Urine: 1.004 — ABNORMAL LOW (ref 1.005–1.030)

## 2017-07-06 LAB — CBC
HEMATOCRIT: 40.9 % (ref 36.0–46.0)
Hemoglobin: 13.8 g/dL (ref 12.0–15.0)
MCH: 32 pg (ref 26.0–34.0)
MCHC: 33.7 g/dL (ref 30.0–36.0)
MCV: 94.9 fL (ref 78.0–100.0)
Platelets: 317 10*3/uL (ref 150–400)
RBC: 4.31 MIL/uL (ref 3.87–5.11)
RDW: 13.1 % (ref 11.5–15.5)
WBC: 9.5 10*3/uL (ref 4.0–10.5)

## 2017-07-06 LAB — COMPREHENSIVE METABOLIC PANEL
ALBUMIN: 3.9 g/dL (ref 3.5–5.0)
ALT: 20 U/L (ref 14–54)
AST: 22 U/L (ref 15–41)
Alkaline Phosphatase: 51 U/L (ref 38–126)
Anion gap: 9 (ref 5–15)
BUN: 13 mg/dL (ref 6–20)
CHLORIDE: 107 mmol/L (ref 101–111)
CO2: 23 mmol/L (ref 22–32)
CREATININE: 0.83 mg/dL (ref 0.44–1.00)
Calcium: 8.9 mg/dL (ref 8.9–10.3)
GFR calc Af Amer: >60 mL/min (ref >60)
GFR calc non Af Amer: >60 mL/min (ref >60)
GLUCOSE: 87 mg/dL (ref 65–99)
Potassium: 3.5 mmol/L (ref 3.5–5.1)
Sodium: 139 mmol/L (ref 135–145)
Total Bilirubin: 0.5 mg/dL (ref 0.3–1.2)
Total Protein: 6.5 g/dL (ref 6.5–8.1)

## 2017-07-06 LAB — I-STAT BETA HCG BLOOD, ED (MC, WL, AP ONLY): I-stat hCG, quantitative: <5 m[IU]/mL (ref <5)

## 2017-07-06 LAB — INFLUENZA PANEL BY PCR (TYPE A & B)
INFLAPCR: NEGATIVE
Influenza B By PCR: NEGATIVE

## 2017-07-06 LAB — LIPASE, BLOOD: LIPASE: 21 U/L (ref 11–51)

## 2017-07-06 MED ORDER — SODIUM CHLORIDE 0.9 % IV BOLUS (SEPSIS)
1000.0000 mL | Freq: Once | INTRAVENOUS | Status: AC
  Administered 2017-07-06: 1000 mL via INTRAVENOUS

## 2017-07-06 MED ORDER — ONDANSETRON 4 MG PO TBDP: 4.0000 mg | ORAL_TABLET | Freq: Three times a day (TID) | ORAL | 0 refills | Status: AC | PRN

## 2017-07-06 MED ORDER — ONDANSETRON HCL 4 MG/2ML IJ SOLN
4.0000 mg | Freq: Once | INTRAMUSCULAR | Status: AC
Start: 2017-07-06 — End: 2017-07-06
  Administered 2017-07-06: 4 mg via INTRAVENOUS
  Filled 2017-07-06: qty 2

## 2017-07-06 NOTE — ED Triage Notes (Signed)
Patient reports when she got to work today, she started experiencing nausea and fatigue. Patient reports she has vomiting 5-6 times. She reports she has vomited 2 with blood in emesis. First time she reports it was bright red and the second time it was dull red. Denies fever, abdominal pain, diarrhea, or constipation.
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
## 2017-07-06 NOTE — Discharge Instructions (Signed)
As discussed, make sure that you stay well-hydrated and get some rest.  Take Zofran as needed for nausea vomiting.  Follow up with your primary care provider.   Return sooner if symptoms worsen, abdominal pain, persistent vomiting, inability to keep in fluids, fever chills or other new concerning symptoms in the meantime.

## 2018-08-03 ENCOUNTER — Emergency Department (HOSPITAL_COMMUNITY): Payer: Self-pay

## 2018-08-03 ENCOUNTER — Encounter (HOSPITAL_COMMUNITY): Payer: Self-pay | Admitting: *Deleted

## 2018-08-03 ENCOUNTER — Other Ambulatory Visit: Payer: Self-pay

## 2018-08-03 ENCOUNTER — Emergency Department (HOSPITAL_COMMUNITY)
Admission: EM | Admit: 2018-08-03 | Discharge: 2018-08-04 | Disposition: A | Discharge status: Home / Self Care | Payer: Self-pay | Attending: Emergency Medicine | Admitting: Emergency Medicine

## 2018-08-03 DIAGNOSIS — R079 Chest pain, unspecified: Secondary | ICD-10-CM | POA: Insufficient documentation

## 2018-08-03 DIAGNOSIS — Z5321 Procedure and treatment not carried out due to patient leaving prior to being seen by health care provider: Secondary | ICD-10-CM | POA: Insufficient documentation

## 2018-08-03 LAB — CBC
HCT: 50.9 % — ABNORMAL HIGH (ref 36.0–46.0)
Hemoglobin: 16.6 g/dL — ABNORMAL HIGH (ref 12.0–15.0)
MCH: 30.5 pg (ref 26.0–34.0)
MCHC: 32.6 g/dL (ref 30.0–36.0)
MCV: 93.6 fL (ref 80.0–100.0)
NRBC: 0 % (ref 0.0–0.2)
PLATELETS: 547 10*3/uL — AB (ref 150–400)
RBC: 5.44 MIL/uL — ABNORMAL HIGH (ref 3.87–5.11)
RDW: 14.7 % (ref 11.5–15.5)
WBC: 11.1 10*3/uL — ABNORMAL HIGH (ref 4.0–10.5)

## 2018-08-03 LAB — BASIC METABOLIC PANEL
Anion gap: 13 (ref 5–15)
BUN: 8 mg/dL (ref 6–20)
CALCIUM: 10.2 mg/dL (ref 8.9–10.3)
CO2: 22 mmol/L (ref 22–32)
CREATININE: 0.83 mg/dL (ref 0.44–1.00)
Chloride: 104 mmol/L (ref 98–111)
Glucose, Bld: 141 mg/dL — ABNORMAL HIGH (ref 70–99)
Potassium: 3.9 mmol/L (ref 3.5–5.1)
Sodium: 139 mmol/L (ref 135–145)

## 2018-08-03 LAB — I-STAT BETA HCG BLOOD, ED (MC, WL, AP ONLY)

## 2018-08-03 LAB — I-STAT TROPONIN, ED: TROPONIN I, POC: 0 ng/mL (ref 0.00–0.08)

## 2018-08-03 MED ORDER — SODIUM CHLORIDE 0.9% FLUSH: 3.0000 mL | Freq: Once | INTRAVENOUS | Status: DC

## 2018-08-03 NOTE — ED Triage Notes (Signed)
Pt is anxious in triage, hyperventilating at times. She says she hurts in her chest. Reports she smoked a blount just prior to arrival, denies ETOH today. Pt is tachy.

## 2018-08-03 NOTE — ED Triage Notes (Signed)
Per ems, c/o anxiety and CP for three days "on and off". Hx of panic attacks. Tingling in the left arm. On EKG, ST. 325 ASA given. Unable to establish IV. ETOH. 140/90, hr 52-83, 98%.

## 2018-08-04 NOTE — ED Notes (Signed)
Pt. Not answering for vitals recheck.  

## 2018-08-04 NOTE — ED Notes (Signed)
Pt. Not answering for vitals or for room x3. Pulling OTF.

## 2019-09-29 ENCOUNTER — Emergency Department (HOSPITAL_COMMUNITY): Payer: No Typology Code available for payment source

## 2019-09-29 ENCOUNTER — Encounter (HOSPITAL_COMMUNITY): Payer: Self-pay | Admitting: Emergency Medicine

## 2019-09-29 ENCOUNTER — Other Ambulatory Visit: Payer: Self-pay

## 2019-09-29 ENCOUNTER — Emergency Department (HOSPITAL_COMMUNITY)
Admission: EM | Admit: 2019-09-29 | Discharge: 2019-09-29 | Disposition: A | Discharge status: Home / Self Care | Payer: No Typology Code available for payment source | Attending: Emergency Medicine | Admitting: Emergency Medicine

## 2019-09-29 DIAGNOSIS — F17210 Nicotine dependence, cigarettes, uncomplicated: Secondary | ICD-10-CM | POA: Diagnosis not present

## 2019-09-29 DIAGNOSIS — M549 Dorsalgia, unspecified: Secondary | ICD-10-CM

## 2019-09-29 DIAGNOSIS — Y999 Unspecified external cause status: Secondary | ICD-10-CM | POA: Insufficient documentation

## 2019-09-29 DIAGNOSIS — S161XXA Strain of muscle, fascia and tendon at neck level, initial encounter: Secondary | ICD-10-CM | POA: Insufficient documentation

## 2019-09-29 DIAGNOSIS — Y929 Unspecified place or not applicable: Secondary | ICD-10-CM | POA: Insufficient documentation

## 2019-09-29 DIAGNOSIS — Y939 Activity, unspecified: Secondary | ICD-10-CM | POA: Insufficient documentation

## 2019-09-29 DIAGNOSIS — M25531 Pain in right wrist: Secondary | ICD-10-CM | POA: Insufficient documentation

## 2019-09-29 DIAGNOSIS — S0240CA Maxillary fracture, right side, initial encounter for closed fracture: Secondary | ICD-10-CM | POA: Diagnosis not present

## 2019-09-29 DIAGNOSIS — T07XXXA Unspecified multiple injuries, initial encounter: Secondary | ICD-10-CM | POA: Diagnosis present

## 2019-09-29 DIAGNOSIS — I10 Essential (primary) hypertension: Secondary | ICD-10-CM | POA: Diagnosis not present

## 2019-09-29 DIAGNOSIS — M546 Pain in thoracic spine: Secondary | ICD-10-CM | POA: Diagnosis not present

## 2019-09-29 LAB — I-STAT BETA HCG BLOOD, ED (MC, WL, AP ONLY): I-stat hCG, quantitative: <5 m[IU]/mL (ref <5)

## 2019-09-29 MED ORDER — OXYCODONE-ACETAMINOPHEN 5-325 MG PO TABS
1.0000 | ORAL_TABLET | Freq: Once | ORAL | Status: AC
  Administered 2019-09-29: 11:00:00 1 via ORAL
  Filled 2019-09-29: qty 1

## 2019-09-29 MED ORDER — MORPHINE SULFATE (PF) 4 MG/ML IV SOLN: 4.0000 mg | Freq: Once | INTRAVENOUS | Status: DC

## 2019-09-29 MED ORDER — METHOCARBAMOL 500 MG PO TABS: 500.0000 mg | ORAL_TABLET | Freq: Two times a day (BID) | ORAL | 0 refills | Status: AC
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
## 2019-09-29 NOTE — ED Notes (Signed)
Pt found pacing in the room, pt removed her IV, stating she was ready to go. RN reviewed discharge instructions with pt. Prescriptions and pain management reviewed, pt had no further questions. Pt ambulated independently to lobby.
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
## 2019-09-29 NOTE — ED Notes (Signed)
Pt transported to CT ?
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
## 2024-06-20 ENCOUNTER — Other Ambulatory Visit: Payer: Self-pay
